# Patient Record
Sex: Male | Born: 1940 | Race: White | Hispanic: No | Smoking: Former smoker
Health system: Southern US, Community
[De-identification: ages and names within clinical notes are randomized; demographics above are authoritative.]

## PROBLEM LIST (undated history)

## (undated) DIAGNOSIS — I2510 Atherosclerotic heart disease of native coronary artery without angina pectoris: Secondary | ICD-10-CM

## (undated) DIAGNOSIS — D696 Thrombocytopenia, unspecified: Secondary | ICD-10-CM

## (undated) DIAGNOSIS — F419 Anxiety disorder, unspecified: Secondary | ICD-10-CM

## (undated) DIAGNOSIS — C801 Malignant (primary) neoplasm, unspecified: Secondary | ICD-10-CM

## (undated) DIAGNOSIS — J45909 Unspecified asthma, uncomplicated: Secondary | ICD-10-CM

## (undated) DIAGNOSIS — D569 Thalassemia, unspecified: Secondary | ICD-10-CM

## (undated) DIAGNOSIS — E785 Hyperlipidemia, unspecified: Secondary | ICD-10-CM

## (undated) DIAGNOSIS — D649 Anemia, unspecified: Secondary | ICD-10-CM

## (undated) DIAGNOSIS — J449 Chronic obstructive pulmonary disease, unspecified: Secondary | ICD-10-CM

## (undated) HISTORY — PX: ESOPHAGUS SURGERY: SHX626

## (undated) HISTORY — DX: Thalassemia, unspecified: D56.9

## (undated) HISTORY — PX: CORONARY STENT PLACEMENT: SHX1402

---

## 2008-12-17 ENCOUNTER — Inpatient Hospital Stay: Payer: Self-pay | Admitting: Internal Medicine

## 2009-03-12 ENCOUNTER — Ambulatory Visit: Payer: Self-pay | Admitting: Gastroenterology

## 2009-09-04 ENCOUNTER — Emergency Department: Payer: Self-pay | Admitting: Emergency Medicine

## 2009-11-11 ENCOUNTER — Ambulatory Visit: Payer: Self-pay | Admitting: Internal Medicine

## 2010-11-18 ENCOUNTER — Emergency Department: Payer: Self-pay | Admitting: Emergency Medicine

## 2012-02-22 ENCOUNTER — Ambulatory Visit: Payer: Self-pay | Admitting: Internal Medicine

## 2013-01-15 ENCOUNTER — Ambulatory Visit: Payer: Self-pay | Admitting: Internal Medicine

## 2013-05-11 ENCOUNTER — Ambulatory Visit: Payer: Self-pay | Admitting: Vascular Surgery

## 2013-05-26 ENCOUNTER — Emergency Department: Payer: Self-pay | Admitting: Emergency Medicine

## 2013-05-26 LAB — TROPONIN I: Troponin-I: <0.02 ng/mL

## 2013-05-26 LAB — BASIC METABOLIC PANEL
Anion Gap: 8 (ref 7–16)
BUN: 11 mg/dL (ref 7–18)
Calcium, Total: 8.6 mg/dL (ref 8.5–10.1)
Chloride: 108 mmol/L — ABNORMAL HIGH (ref 98–107)
Creatinine: 0.94 mg/dL (ref 0.60–1.30)
EGFR (Non-African Amer.): >60
Glucose: 95 mg/dL (ref 65–99)

## 2013-05-26 LAB — CBC
HCT: 34.3 % — ABNORMAL LOW (ref 40.0–52.0)
HGB: 10.5 g/dL — ABNORMAL LOW (ref 13.0–18.0)
MCHC: 30.5 g/dL — ABNORMAL LOW (ref 32.0–36.0)
MCV: 63 fL — ABNORMAL LOW (ref 80–100)
Platelet: 132 10*3/uL — ABNORMAL LOW (ref 150–440)
RDW: 16.1 % — ABNORMAL HIGH (ref 11.5–14.5)
WBC: 11.5 10*3/uL — ABNORMAL HIGH (ref 3.8–10.6)

## 2013-05-29 ENCOUNTER — Emergency Department: Payer: Self-pay | Admitting: Emergency Medicine

## 2013-07-18 ENCOUNTER — Ambulatory Visit: Payer: Self-pay | Admitting: Gastroenterology

## 2013-07-24 ENCOUNTER — Ambulatory Visit: Payer: Self-pay | Admitting: Gastroenterology

## 2013-07-30 LAB — PATHOLOGY REPORT

## 2013-08-02 ENCOUNTER — Ambulatory Visit: Payer: Self-pay | Admitting: Gastroenterology

## 2014-06-26 ENCOUNTER — Ambulatory Visit: Payer: Self-pay | Admitting: Vascular Surgery

## 2014-07-24 ENCOUNTER — Observation Stay: Payer: Self-pay | Admitting: Vascular Surgery

## 2014-07-24 LAB — BASIC METABOLIC PANEL
Anion Gap: 4 — ABNORMAL LOW (ref 7–16)
BUN: 12 mg/dL (ref 7–18)
CHLORIDE: 108 mmol/L — AB (ref 98–107)
Calcium, Total: 8.5 mg/dL (ref 8.5–10.1)
Co2: 31 mmol/L (ref 21–32)
Creatinine: 1.17 mg/dL (ref 0.60–1.30)
EGFR (African American): >60
Glucose: 99 mg/dL (ref 65–99)
Osmolality: 285 (ref 275–301)
POTASSIUM: 3.6 mmol/L (ref 3.5–5.1)
Sodium: 143 mmol/L (ref 136–145)

## 2014-07-25 LAB — BASIC METABOLIC PANEL
Anion Gap: 6 — ABNORMAL LOW (ref 7–16)
BUN: 11 mg/dL (ref 7–18)
CALCIUM: 8.3 mg/dL — AB (ref 8.5–10.1)
CHLORIDE: 106 mmol/L (ref 98–107)
Co2: 29 mmol/L (ref 21–32)
Creatinine: 0.99 mg/dL (ref 0.60–1.30)
EGFR (Non-African Amer.): >60
Glucose: 86 mg/dL (ref 65–99)
OSMOLALITY: 280 (ref 275–301)
Potassium: 3.6 mmol/L (ref 3.5–5.1)
Sodium: 141 mmol/L (ref 136–145)

## 2014-07-25 LAB — CBC WITH DIFFERENTIAL/PLATELET
Basophil #: 0 10*3/uL (ref 0.0–0.1)
Basophil %: 0.4 %
EOS PCT: 1.7 %
Eosinophil #: 0.1 10*3/uL (ref 0.0–0.7)
HCT: 26.6 % — ABNORMAL LOW (ref 40.0–52.0)
HGB: 8.2 g/dL — AB (ref 13.0–18.0)
LYMPHS ABS: 1.1 10*3/uL (ref 1.0–3.6)
Lymphocyte %: 17.6 %
MCH: 20.5 pg — ABNORMAL LOW (ref 26.0–34.0)
MCHC: 31.1 g/dL — ABNORMAL LOW (ref 32.0–36.0)
MCV: 66 fL — AB (ref 80–100)
Monocyte #: 0.4 x10 3/mm (ref 0.2–1.0)
Monocyte %: 5.9 %
Neutrophil #: 4.8 10*3/uL (ref 1.4–6.5)
Neutrophil %: 74.4 %
Platelet: 131 10*3/uL — ABNORMAL LOW (ref 150–440)
RBC: 4.03 10*6/uL — AB (ref 4.40–5.90)
RDW: 15.4 % — ABNORMAL HIGH (ref 11.5–14.5)
WBC: 6.4 10*3/uL (ref 3.8–10.6)

## 2014-07-25 LAB — APTT: Activated PTT: 32.1 secs (ref 23.6–35.9)

## 2014-07-25 LAB — PROTIME-INR
INR: 1.2
PROTHROMBIN TIME: 15.6 s — AB

## 2014-08-15 ENCOUNTER — Inpatient Hospital Stay: Payer: Self-pay | Admitting: Internal Medicine

## 2014-10-20 NOTE — Discharge Summary (Signed)
PATIENT NAME:  Charles Rose, Charles Rose MR#:  [id number] DATE OF BIRTH:  1940-11-25  DATE OF ADMISSION:  08/15/2014 DATE OF DISCHARGE:  08/19/2014  DIAGNOSIS AT TIME OF DISCHARGE:  1.  Chronic obstructive pulmonary exacerbation with acute bronchitis secondary to Staphylococcus aureus.  2.  Anemia.  3.  History of esophageal carcinoma.  4.  Chronic respiratory failure secondary to chronic obstructive pulmonary disease.   CHIEF COMPLAINT:  1.  Cough with productive thick sputum. 2.  Shortness of breath.   HISTORY OF PRESENT ILLNESS: Cicero Noy is a 74 year old male with past medical history significant for COPD, chronic respiratory failure on oxygen therapy at 2 liters nasal cannula, presents to the hospital complaining of shortness of breath, cough and thick, productive brown sputum. The patient had been started on amoxicillin as well as prednisone taper, but continued to be symptomatic. Past medical history is significant for right carotid stenosis with recent percutaneous transluminal angioplasty and stent placement of the right internal carotid artery done on 07/24/2014 by Dr. Delana Meyer. History of COPD, history of thalassemia minor, history of coronary artery disease, status post PCI and stent placement to the RCA, history of GERD, esophageal cancer and thrombocytopenia. Please see H and P for full details.   The patient was admitted to [facility] and received intravenous antibiotics in addition to bronchodilator therapy as well as IV steroid therapy. Chest x-ray on admission showed hyperinflation with patchy airspace disease and streaky interstitial prominence of the right lower lobe perihilar/infrahilar areas, a small amount of patchy airspace disease at the lingula suspicious for multifocal pneumonia. Sputum grew moderate growth of Staphylococcus aureus that was sensitive to Levaquin. The patient was started on intravenous vancomycin and subsequently switched to p.o. Levaquin. His Solu-Medrol was gradually  tapered. His hemoglobin remained stable at 8.6. The patient gradually improved during his stay in the hospital and was discharged home in stable condition on the following medications: Levaquin 500 mg p.o. daily for 7 days; prednisone taper starting at 30 mg a day for 3 days and decrease by 10 mg every 3 days until gone; clopidogrel 75 mg once a day; Spiriva HandiHaler 18 mcg 1 capsule once a day; simvastatin 40 mg once a day; ranitidine 150 p.o. b.i.d.; omeprazole 40 mg once a day; omega-3 fatty acid 1 capsule once a day; Nitrostat 0.4 mg sublingual p.r.n.; Imdur 30 mg once a day; Advair Diskus 250/50 one puff b.i.d.; ferrous sulfate 325 mg once a day; albuterol nebulizer 3 mL q. 6 p.r.n.; ProAir HFA 2 puffs q.i.d. p.r.n.; aspirin 81 mg once a day.   DISCHARGE INSTRUCTIONS: The patient was advised to continue his oxygen at 2 liters nasal cannula and followup with me, Dr. Ginette Pitman, in 1 to 2 weeks' time.   CONDITION ON DISCHARGE: The patient is stable at the time of discharge.   TOTAL TIME SPENT IN DISCHARGING THE PATIENT: Thirty-five minutes.    ____________________________ Tracie Harrier, MD vh:TM D: 08/28/2014 13:26:32 ET T: 08/28/2014 22:57:16 ET JOB#: [id number]  cc: Tracie Harrier, MD, <Dictator> Tracie Harrier MD ELECTRONICALLY SIGNED 09/16/2014 18:07

## 2014-10-20 NOTE — H&P (Signed)
PATIENT NAME:  Charles Rose, COY MR#:  [id number] DATE OF BIRTH:  [birth year]  DATE OF ADMISSION:  08/15/2014  PRIMARY CARE PHYSICIAN: Dr. Ginette Pitman.    PULMONOLOGIST: Dr. Raul Del.   HISTORY OF PRESENT ILLNESS:  The patient is a 74 year old Caucasian male with past medical history significant for history of COPD, chronic respiratory failure on oxygen therapy at home at 2 liters of oxygen through nasal cannula, who comes to the hospital with complaints of shortness of breath, cough, and thick phlegm. According to the patient he was doing well up until his right CEA stent placement operation on 07/24/2014. Postoperatively he was discharged and he was doing well although approximately a week ago he started noticing cough as well as thick phlegm production, initially phlegm looked brown. He was initiated on amoxicillin as well as prednisone taper and his phlegm became dirty white, lighter in the color, however still very thick and difficult to lift it up. He has been wheezing, very short of breath especially whenever he walks around. He has also been weak and because he lives alone he was concerned about passing and injuring himself, so he decided to come to the Emergency Room for further evaluation. In the Emergency Room he had an x-ray done of his lungs and he was noted to have multifocal pneumonia on the right side of his lungs.  Hospitalist services were contacted for admission.   PAST MEDICAL HISTORY: Significant for history of recent right carotid stenosis operation, the patient had percutaneous transluminal angioplasty and stent placement in the right internal carotid artery done on 07/24/2014 by Dr. Delana Meyer, history of chronic respiratory failure on 2 liters of oxygen through nasal cannula at home, COPD, history of minor thalassemia, coronary artery disease status post PCI, stent placed in RCA in June of 2010, history of gastroesophageal reflux disease as well as esophageal cancer status post operation  recently, approximately 8 months ago at [facility], thrombocytopenia.   MEDICATIONS: According to medical records the patient is on Advair Diskus 250/50 one puff twice daily, albuterol 1 inhalation every 6 hours, aspirin 81 mg p.o. daily, Plavix 75 mg p.o. daily, iron sulfate 325 mg p.o. once daily, Imdur 30 mg p.o. daily, Nitrostat 0.4 mg sublingually every 5 minutes as needed, omega 3 fatty acids 1 capsule once daily, omeprazole 40 mg p.o. daily, prednisone 10 mg p.o. taper, also albuterol MDI 2 puffs 4 times daily as needed, ranitidine 150 mg twice daily, simvastatin 20 mg p.o. daily, Spiriva 18 mcg once daily.   SOCIAL HISTORY: The patient used to smoke 1-1/2 packs a day for approximately 40 years, quit in March 2010. No alcohol abuse, drug abuse.  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  Lives alone in [place] area. He is divorced. He is accompanied by his sister here in the hospital.   FAMILY HISTORY: Congestive heart failure in patient's father. The patient's mother had diabetes as well as thalassemia.   ALLERGIES: No known drug allergies.   REVIEW OF SYSTEMS:  Positive for cough, phlegm production, feeling hot and chilly and fatigued in the home.  He has headaches. Also weight loss approximately 24 pounds since Christmas.  He admits of having poor appetite as well as difficulty chewing his food since he has no dentures. He admits of having some floaters in the left eye, also bifocal glasses.  He has significant nearsightedness.  He is supposed to see ophthalmologist in May 2016 to possibly get cataracts removed.  Also admits of shortness of breath especially whenever he moves  around although he admits to checking his O2 saturations at home and it was 95% on room air. He admits of having tachycardia intermittently and feeling arrhythmia, also feeling presyncopal. In December he fell down and hit his right cheekbone, injured himself. He is afraid about the same although denies any significant  lightheadedness or dizziness recently. Admits of having intermittent diarrhea and constipation. His last bowel movement was 1-2 days ago. Admits of having weak stream whenever he urinates, however states that he empties his bladder well. Admits of itchy skin which he attributes to nerves.  Denies any high fevers, weight gain.   EYES:  Denies any blurry vision, double vision, glaucoma.  EARS, NOSE, AND THROAT: Denies any tinnitus, allergies, epistaxis, sinus pain, dentures, difficulty swallowing.  RESPIRATORY: Denies hemoptysis, asthma. Admits to COPD.   CARDIOVASCULAR:  Denies any chest pain, orthopnea, edema, or palpitations.  GASTROINTESTINAL: Denies any nausea, vomiting, rectal bleeding, change in bowel habits.  GENITOURINARY: Denies dysuria, hematuria, frequency, incontinence.  ENDOCRINOLOGY: Denies polydipsia, nocturia, thyroid problems, heat or cold intolerance, or thirst.   HEMATOLOGIC: Denies anemia, easy bruising or bleeding, swollen glands.  SKIN: Denies acne, rashes, lesions, change in moles.  MUSCULOSKELETAL: Denies arthritis, cramps, swelling.  NEUROLOGIC:  Denies numbness, epilepsy, tremor.  PSYCHIATRIC: Denies anxiety, insomnia, depression.   PHYSICAL EXAMINATION:   VITAL SIGNS:  On arrival to the hospital the patient's vital signs, temperature was 98.5, pulse was 90, respirations 24, blood pressure 108/61, saturation was 93% on room air.  GENERAL: Thin Caucasian male sitting on the stretcher.  HEENT: His pupils are equal and reactive to light. Extraocular muscles intact. No icterus or conjunctivitis. Has normal hearing. No pharyngeal erythema. Mucosa is dry.  NECK: No masses. Supple, nontender. Thyroid is not enlarged. No adenopathy. No JVD or carotid bruits bilaterally. Full range of motion.  LUNGS: Relatively good air entrance bilaterally, however significantly diminished at bases bilaterally. The patient has rhonchi as well as rales and wheezing whenever he coughs, especially in  the right base. The patient does have labored inspirations as well as increased effort to breathe, is in mild respiratory distress whenever he tries to cough or speak longer sentences.  CARDIOVASCULAR: S1, S2 appreciated. Rhythm is regular. PMI not lateralized.  Chest is nontender to palpation.  1 + pedal pulses.   EXTREMITIES:  No lower extremity edema, calf tenderness, or cyanosis was noted.  ABDOMEN: Soft, nontender. Bowel sounds are present. No hepatosplenomegaly or masses were noted.  RECTAL: Deferred.  MUSCLE STRENGTH: Able to move all extremities. No cyanosis, degenerative joint disease, or kyphosis. Gait was not tested.  SKIN: Did not reveal any rashes, lesions, erythema, nodularity, or induration. It was warm and dry to palpation.  LYMPHATIC: No adenopathy in the cervical region.  NEUROLOGICAL: Cranial nerves grossly intact. Sensory is intact.  No dysarthria or aphasia. The patient is alert, oriented to time, person, and place, cooperative. Memory is good.  PSYCHIATRIC: No significant confusion, agitation, or depression.   EKG showed normal sinus rhythm at the 89 beats per minute, right bundle branch block, left anterior fascicular block, and nonspecific ST-T changes.   LABORATORY DATA: BMP showed sodium 147 and chloride 111. Calcium level 8.2. Troponin less than 0.02. White blood cell count is normal at 7.5, hemoglobin is 8.6, platelet count was 210,000. In comparison the patient's hemoglobin level was 8.2 on 07/25/2014. He has MCV is low at 66.   RADIOLOGIC STUDIES:  Chest x-ray PA and lateral 08/11/2014 showed hyperinflation, patchy airspace disease with streaky  interstitial prominence in the right lower lobe, perihilar, and infrahilar area, a small amount of patchy airspace disease in the lingula, findings were suspicious for multifocal pneumonia.   ASSESSMENT AND PLAN:  1.  Right lower lobe and lingula as well as infrahilar pneumonia due to unknown infectious agent at this time,  questionable aspiration perioperatively. We will get sputum cultures. We will continue the patient on Rocephin and Zithromax for now.  2.  Chronic obstructive pulmonary disease exacerbation. We will continue Solu-Medrol and inhalers as well as nebulizers.  3.  Hyponatremia. We will initiate the patient on D5 water.  4.  Dysphagia. We will initiate the patient on mechanical soft diet for now and ask speech therapist to see patient.  5.  Anemia. Get guaiac.  6.  History of thrombocytopenia. The patient's platelet count is normal at present.  7.  Generalized weakness. Will get physical therapist involved for further recommendations. The patient may benefit from home health physical therapy.   TIME SPENT: 45 minutes.     ____________________________ Theodoro Grist, MD rv:bu D: 08/15/2014 14:27:59 ET T: 08/15/2014 15:33:41 ET JOB#: [id number]  cc: Theodoro Grist, MD, <Dictator> Tracie Harrier, MD Melquiades Kovar MD ELECTRONICALLY SIGNED 09/22/2014 12:50

## 2014-10-20 NOTE — Op Note (Signed)
PATIENT NAME:  Charles Rose, Charles Rose MR#:  [id number] DATE OF BIRTH:  1940/09/16  DATE OF PROCEDURE:  07/24/2014  PREOPERATIVE DIAGNOSES:  1.  Symptomatic right internal carotid artery.  2.  Transient ischemic attack.  3.  Chronic obstructive pulmonary disease.  4.  Coronary artery disease.  POSTOPERATIVE DIAGNOSES: 1.  Symptomatic right internal carotid artery.  2.  Transient ischemic attack.  3.  Chronic obstructive pulmonary disease.  4.  Coronary artery disease.  PROCEDURES PERFORMED: 1.  Arch aortogram. 2.  Selective injection of the right cervical and cerebral carotid arteries.  3.  Percutaneous transluminal angioplasty and stent placement, right internal carotid artery.   PROCEDURE PERFORMED BY: Katha Cabal, MD   ASSISTANT: Algernon Huxley, MD  SEDATION: Versed 3 mg plus fentanyl 100 mcg administered IV. Continuous ECG, pulse oximetry and cardiopulmonary monitoring is performed throughout the entire procedure by the interventional radiology nurse. Total sedation time is 40 minutes.   ACCESS: A 6 French sheath, right common femoral artery.   FLUOROSCOPY TIME: 4.7 minutes.   CONTRAST USED: Isovue 75 mL.   INDICATIONS: Mr. Clerk is a 74 year old gentleman who presents to the office after neurological changes. Workup demonstrated rather advanced COPD. He also has mild coronary artery disease. Given his high risk lung condition as well as his symptomatic carotid, carotid stenting is recommended. Surgery was also discussed. The risks and benefits were reviewed. All questions answered. The patient wishes to proceed.   DESCRIPTION OF PROCEDURE: The patient is taken to special procedures, placed in the supine position. After adequate sedation is achieved, both groins are prepped and draped in sterile fashion. Ultrasound is placed in a sterile sleeve. Ultrasound is utilized secondary to lack of appropriate landmarks and to avoid vascular injury. Under real-time visualization, the common  femoral artery is anesthetized with 1% lidocaine and then accessed with a micropuncture needle, microwire, followed by micro sheath, J-wire followed by a 5 French sheath and 5 French pigtail catheter.   The pigtail catheter is positioned in the ascending aorta and an LAO projection of the arch is obtained. Stiff-angled Glidewire is then used to removed the pigtail catheter and a JB-1 catheter is advanced over the wire and used to select the innominate. Wire is then advanced into the carotid and the catheter is advanced up to the midportion of the common carotid artery.   Hand injection of contrast then demonstrates the carotid anatomy, and upon visualizing the origin of the external carotid, the stiff-angled Glidewire is selected and the catheter is then advanced into the external carotid artery. Once access had been achieved, 5000 units of heparin was given. With the catheter in the external iliac, the Amplatz Super Stiff wire is advanced, catheter is removed, and the 6 [place] shuttle sheath is advanced over the wire and positioned with its tip in the distal common carotid. ACT is now attained, it is 280, and therefore an additional 1000 units of heparin is given.   Both AP and lateral cervical and cerebral views are then obtained. Best working angle is the lateral and in this the detector is positioned. The large Emboshield is advanced through the sheath and negotiated into the distal internal carotid artery. It is deployed without difficulty. A 9 x 7, 40 cm long tapered Xact stent is then advanced across the lesion. Image is obtained for placement and the stent deployed. The patient was pretreated with 1 amp of atropine with a baseline heart rate of 62. The 5 x 2 balloon was  then advanced across the waist within the stent, inflated to 10 atmospheres briefly and then taken down. Followup imaging demonstrates excellent stent positioning with good apposition and therefore the Emboshield is retrieved without  difficulty.   Once again, cerebral and cervical views are obtained. There is significant improvement in filling of the anterior cerebral on the intracranial imaging. The cervical imaging demonstrates the stent is well approximated and expanded quite nicely.   The sheath is then pulled into the external iliac on the right. Oblique view is obtained and a StarClose device deployed. There are no immediate complications.   INTERPRETATIONS: Initial imaging demonstrates the aortic arch is of normal configuration. There are no hemodynamically significant ostial stenoses of the great vessels. Within the internal carotid artery there is a string sign noted. External origin bulb and common carotid appear widely patent. Intracranially, there is filling of the middle cerebral as well as the anterior, but the anterior initially is somewhat poor to fill, quite slow and does not arborize well.   Following stent placement as described above, there is excellent placement of the stent with significant improvement in flow filling the anterior cerebral.   The patient tolerated the procedure well. There were no neurological changes. He is awake, alert and moving all extremities following commands at the conclusion of the procedure.   ____________________________ Katha Cabal, MD ggs:TT D: 07/24/2014 11:37:36 ET T: 07/24/2014 16:01:24 ET JOB#: [id number]  cc: Katha Cabal, MD, <Dictator> Tracie Harrier, MD Katha Cabal MD ELECTRONICALLY SIGNED 07/24/2014 17:44

## 2015-01-01 ENCOUNTER — Emergency Department
Admission: EM | Admit: 2015-01-01 | Discharge: 2015-01-02 | Disposition: A | Discharge status: Home / Self Care | Payer: Medicare Other | Attending: Emergency Medicine | Admitting: Emergency Medicine

## 2015-01-01 ENCOUNTER — Encounter: Payer: Self-pay | Admitting: Emergency Medicine

## 2015-01-01 ENCOUNTER — Emergency Department: Payer: Medicare Other

## 2015-01-01 DIAGNOSIS — S22000A Wedge compression fracture of unspecified thoracic vertebra, initial encounter for closed fracture: Secondary | ICD-10-CM

## 2015-01-01 DIAGNOSIS — Y9389 Activity, other specified: Secondary | ICD-10-CM | POA: Insufficient documentation

## 2015-01-01 DIAGNOSIS — S22089A Unspecified fracture of T11-T12 vertebra, initial encounter for closed fracture: Secondary | ICD-10-CM | POA: Diagnosis not present

## 2015-01-01 DIAGNOSIS — I10 Essential (primary) hypertension: Secondary | ICD-10-CM | POA: Diagnosis not present

## 2015-01-01 DIAGNOSIS — Y998 Other external cause status: Secondary | ICD-10-CM | POA: Diagnosis not present

## 2015-01-01 DIAGNOSIS — J441 Chronic obstructive pulmonary disease with (acute) exacerbation: Secondary | ICD-10-CM | POA: Diagnosis not present

## 2015-01-01 DIAGNOSIS — S22069A Unspecified fracture of T7-T8 vertebra, initial encounter for closed fracture: Secondary | ICD-10-CM | POA: Diagnosis not present

## 2015-01-01 DIAGNOSIS — R079 Chest pain, unspecified: Secondary | ICD-10-CM | POA: Diagnosis present

## 2015-01-01 DIAGNOSIS — X58XXXA Exposure to other specified factors, initial encounter: Secondary | ICD-10-CM | POA: Diagnosis not present

## 2015-01-01 DIAGNOSIS — Y9289 Other specified places as the place of occurrence of the external cause: Secondary | ICD-10-CM | POA: Diagnosis not present

## 2015-01-01 HISTORY — DX: Anxiety disorder, unspecified: F41.9

## 2015-01-01 HISTORY — DX: Chronic obstructive pulmonary disease, unspecified: J44.9

## 2015-01-01 HISTORY — DX: Atherosclerotic heart disease of native coronary artery without angina pectoris: I25.10

## 2015-01-01 HISTORY — DX: Malignant (primary) neoplasm, unspecified: C80.1

## 2015-01-01 HISTORY — DX: Anemia, unspecified: D64.9

## 2015-01-01 HISTORY — DX: Thrombocytopenia, unspecified: D69.6

## 2015-01-01 HISTORY — DX: Unspecified asthma, uncomplicated: J45.909

## 2015-01-01 HISTORY — DX: Hyperlipidemia, unspecified: E78.5

## 2015-01-01 LAB — CBC WITH DIFFERENTIAL/PLATELET
BASOS PCT: 0 %
Basophils Absolute: 0 10*3/uL (ref 0–0.1)
EOS ABS: 0.1 10*3/uL (ref 0–0.7)
Eosinophils Relative: 1 %
HCT: 32.3 % — ABNORMAL LOW (ref 40.0–52.0)
Hemoglobin: 10.1 g/dL — ABNORMAL LOW (ref 13.0–18.0)
LYMPHS PCT: 14 %
Lymphs Abs: 1.6 10*3/uL (ref 1.0–3.6)
MCH: 20.4 pg — ABNORMAL LOW (ref 26.0–34.0)
MCHC: 31.2 g/dL — ABNORMAL LOW (ref 32.0–36.0)
MCV: 65.4 fL — ABNORMAL LOW (ref 80.0–100.0)
MONOS PCT: 8 %
Monocytes Absolute: 0.9 10*3/uL (ref 0.2–1.0)
Neutro Abs: 8.8 10*3/uL — ABNORMAL HIGH (ref 1.4–6.5)
Neutrophils Relative %: 77 %
PLATELETS: 136 10*3/uL — AB (ref 150–440)
RBC: 4.94 MIL/uL (ref 4.40–5.90)
RDW: 15.8 % — AB (ref 11.5–14.5)
WBC: 11.5 10*3/uL — ABNORMAL HIGH (ref 3.8–10.6)

## 2015-01-01 LAB — COMPREHENSIVE METABOLIC PANEL
ALBUMIN: 3.9 g/dL (ref 3.5–5.0)
ALT: 29 U/L (ref 17–63)
AST: 25 U/L (ref 15–41)
Alkaline Phosphatase: 49 U/L (ref 38–126)
Anion gap: 8 (ref 5–15)
BILIRUBIN TOTAL: 1 mg/dL (ref 0.3–1.2)
BUN: 17 mg/dL (ref 6–20)
CO2: 28 mmol/L (ref 22–32)
Calcium: 8.6 mg/dL — ABNORMAL LOW (ref 8.9–10.3)
Chloride: 103 mmol/L (ref 101–111)
Creatinine, Ser: 0.88 mg/dL (ref 0.61–1.24)
GFR calc Af Amer: >60 mL/min (ref >60)
GFR calc non Af Amer: >60 mL/min (ref >60)
Glucose, Bld: 93 mg/dL (ref 65–99)
Potassium: 3.7 mmol/L (ref 3.5–5.1)
Sodium: 139 mmol/L (ref 135–145)
Total Protein: 6.1 g/dL — ABNORMAL LOW (ref 6.5–8.1)

## 2015-01-01 LAB — TROPONIN I: Troponin I: <0.03 ng/mL (ref <0.031)

## 2015-01-01 MED ORDER — IOHEXOL 350 MG/ML SOLN
100.0000 mL | Freq: Once | INTRAVENOUS | Status: AC | PRN
  Administered 2015-01-01: 100 mL via INTRAVENOUS

## 2015-01-01 MED ORDER — METHYLPREDNISOLONE SODIUM SUCC 125 MG IJ SOLR
125.0000 mg | Freq: Once | INTRAMUSCULAR | Status: AC
  Administered 2015-01-01: 125 mg via INTRAVENOUS
  Filled 2015-01-01: qty 2

## 2015-01-01 MED ORDER — IPRATROPIUM-ALBUTEROL 0.5-2.5 (3) MG/3ML IN SOLN
3.0000 mL | Freq: Once | RESPIRATORY_TRACT | Status: AC
  Administered 2015-01-01: 3 mL via RESPIRATORY_TRACT
  Filled 2015-01-01: qty 3

## 2015-01-01 NOTE — ED Notes (Signed)
Patient back from CT.

## 2015-01-01 NOTE — ED Provider Notes (Signed)
[facility] Emergency Department Provider Note  Time seen: 10:12 PM  I have reviewed the triage vital signs and the nursing notes.   HISTORY  Chief Complaint Chest Pain    HPI Macari Zalesky is a 74 y.o. male with a past medical history of hypertension, hyperlipidemia, COPD, presents the emergency department with chest pain since 3 PM. According to the patient he developed chest pain and tightness since 3 PM today. He states it is worse when he takes a deep breath, or any time he breathes in. Patient states he rarely gets chest pain. He does have a history of a coronary stent approximately 5 years ago by Dr. Nehemiah Massed. He also states a known stent to his right carotid artery. Patient currently takes aspirin and Plavix. Patient took nitroglycerin at home with minimal relief of his discomfort. So he decided to come to the emergency department for evaluation. Patient denies any nausea, he does state some mild shortness of breath, moderate chest pain with inspiration. Patient states the chest pain is constant, but worse with inspiration. Denies any leg swelling or pain. Denies any history of blood clots in the past. The patient does have a history of esophageal cancer which per the patient is currently in remission.     No past medical history on file.  There are no active problems to display for this patient.   No past surgical history on file.  No current outpatient prescriptions on file.  Allergies Review of patient's allergies indicates not on file.  No family history on file.  Social History History  Substance Use Topics  . Smoking status: Not on file  . Smokeless tobacco: Not on file  . Alcohol Use: Not on file    Review of Systems Constitutional: Negative for fever. Cardiovascular: Positive for chest pain Respiratory: Positive for shortness of breath. Chest pain worse with inspiration. Gastrointestinal: Negative for abdominal pain, vomiting  and diarrhea. Genitourinary: Negative for dysuria. Musculoskeletal: Negative for back pain.  10-point ROS otherwise negative.  ____________________________________________   PHYSICAL EXAM:  VITAL SIGNS: ED Triage Vitals  Enc Vitals Group     BP --      Pulse --      Resp --      Temp --      Temp src --      SpO2 01/01/15 2203 94 %     Weight --      Height --      Head Cir --      Peak Flow --      Pain Score --      Pain Loc --      Pain Edu? --      Excl. in [place]? --     Constitutional: Alert and oriented. Well appearing and in no distress. Eyes: Normal exam ENT   Mouth/Throat: Mucous membranes are moist. Cardiovascular: Normal rate, regular rhythm. No murmur Respiratory: Normal respiratory effort without tachypnea nor retractions. No tenderness to palpation. Patient does have moderate wheezes bilaterally. Gastrointestinal: Soft and nontender. No distention.   Musculoskeletal: Nontender with normal range of motion in all extremities. No lower extremity tenderness or edema. Neurologic:  Normal speech and language. No gross focal neurologic deficits are appreciated. Speech is normal. Skin:  Skin is warm, dry and intact.  Psychiatric: Mood and affect are normal. Speech and behavior are normal. Patient exhibits appropriate insight and judgment.  ____________________________________________    EKG  EKG reviewed and interpreted by myself shows  normal sinus rhythm at 83 bpm, widened QRS, left axis deviation, most consistent with a bifascicular block, nonspecific ST changes present. No ST elevations noted.  ____________________________________________    RADIOLOGY  CT negative for PE.  ____________________________________________    INITIAL IMPRESSION / ASSESSMENT AND PLAN / ED COURSE  Pertinent labs & imaging results that were available during my care of the patient were reviewed by me and considered in my medical decision making (see chart for  details).  Patient presents with approximately 7 hours of chest pain. Patient states the chest pain is worse with inspiration. Given his history of cancer, and pleuritic chest pain we will proceed with a CT scan to evaluate for pulmonary emboli. Given the patient's history of coronary stents, we will obtain lab work including troponin to help further evaluate. Patient does have moderate wheeze on expiration, has a history of COPD but denies any home oxygen use. We will treat with Solu-Medrol and DuoNeb's while awaiting laboratory and CT results. Patient took a full-strength aspirin in route to the hospital.  Pt care signed out to Dr. Reita Cliche CT pending  ____________________________________________   FINAL CLINICAL IMPRESSION(S) / ED DIAGNOSES  Chest pain COPD exacerbation   Harvest Dark, MD 01/06/15 2137

## 2015-01-01 NOTE — ED Notes (Signed)
Patient present to ED with complaint of chest pain since 3:00 am this morning, reports pain woke him up and has been in continuous pain all day today. Nook 1 Nitro at home with no relief, reports pain increases when taking in deep breaths. Per EMS 1 Nitro given without relief and 324 Aspirin given en route. Patient is on O2 PRN, patient states normally he is on O2 80% of the time but states this week he has been on it 100% of time. Patient reports has had 20 lbs of unintentional weight loss in the last month. Patient treated by his PCP for upper respiratory infection. Patient a&o x 4, respirations even and unlabored, does not appear to have a work increase in breathing. MD at bedside, call bell within reach.

## 2015-01-02 NOTE — Discharge Instructions (Signed)
We discussed no certain cause was found for your chest pain, however it is suspect it's either due to esophageal pain, or COPD. Your CT scan showed a couple of thoracic spine compression fractures that you need to discuss with your doctor further evaluation to make sure that these are not related to bone cancer.  Return to the emergency department for any new or worsening condition including trouble breathing, wheezing, fever, altered mental status, chest pain, nausea, sweating, passing out.  You need to see your primary care doctor this week, one to 2 days. He will likely also need to be referred to see Dr. Raul Del within the next week as well.   Chest Pain (Nonspecific) It is often hard to give a specific diagnosis for the cause of chest pain. There is always a chance that your pain could be related to something serious, such as a heart attack or a blood clot in the lungs. You need to follow up with your health care provider for further evaluation. CAUSES   Heartburn.  Pneumonia or bronchitis.  Anxiety or stress.  Inflammation around your heart (pericarditis) or lung (pleuritis or pleurisy).  A blood clot in the lung.  A collapsed lung (pneumothorax). It can develop suddenly on its own (spontaneous pneumothorax) or from trauma to the chest.  Shingles infection (herpes zoster virus). The chest wall is composed of bones, muscles, and cartilage. Any of these can be the source of the pain.  The bones can be bruised by injury.  The muscles or cartilage can be strained by coughing or overwork.  The cartilage can be affected by inflammation and become sore (costochondritis). DIAGNOSIS  Lab tests or other studies may be needed to find the cause of your pain. Your health care provider may have you take a test called an ambulatory electrocardiogram (ECG). An ECG records your heartbeat patterns over a 24-hour period. You may also have other tests, such as:  Transthoracic echocardiogram  (TTE). During echocardiography, sound waves are used to evaluate how blood flows through your heart.  Transesophageal echocardiogram (TEE).  Cardiac monitoring. This allows your health care provider to monitor your heart rate and rhythm in real time.  Holter monitor. This is a portable device that records your heartbeat and can help diagnose heart arrhythmias. It allows your health care provider to track your heart activity for several days, if needed.  Stress tests by exercise or by giving medicine that makes the heart beat faster. TREATMENT   Treatment depends on what may be causing your chest pain. Treatment may include:  Acid blockers for heartburn.  Anti-inflammatory medicine.  Pain medicine for inflammatory conditions.  Antibiotics if an infection is present.  You may be advised to change lifestyle habits. This includes stopping smoking and avoiding alcohol, caffeine, and chocolate.  You may be advised to keep your head raised (elevated) when sleeping. This reduces the chance of acid going backward from your stomach into your esophagus. Most of the time, nonspecific chest pain will improve within 2-3 days with rest and mild pain medicine.  HOME CARE INSTRUCTIONS   If antibiotics were prescribed, take them as directed. Finish them even if you start to feel better.  For the next few days, avoid physical activities that bring on chest pain. Continue physical activities as directed.  Do not use any tobacco products, including cigarettes, chewing tobacco, or electronic cigarettes.  Avoid drinking alcohol.  Only take medicine as directed by your health care provider.  Follow your health care provider's  suggestions for further testing if your chest pain does not go away.  Keep any follow-up appointments you made. If you do not go to an appointment, you could develop lasting (chronic) problems with pain. If there is any problem keeping an appointment, call to reschedule. SEEK  MEDICAL CARE IF:   Your chest pain does not go away, even after treatment.  You have a rash with blisters on your chest.  You have a fever. SEEK IMMEDIATE MEDICAL CARE IF:   You have increased chest pain or pain that spreads to your arm, neck, jaw, back, or abdomen.  You have shortness of breath.  You have an increasing cough, or you cough up blood.  You have severe back or abdominal pain.  You feel nauseous or vomit.  You have severe weakness.  You faint.  You have chills. This is an emergency. Do not wait to see if the pain will go away. Get medical help at once. Call your local emergency services (911 in [place] Do not drive yourself to the hospital. MAKE SURE YOU:   Understand these instructions.  Will watch your condition.  Will get help right away if you are not doing well or get worse. Document Released: 03/17/2005 Document Revised: 06/12/2013 Document Reviewed: 01/11/2008 [facility] Patient Information 2015 [place], [place]. This information is not intended to replace advice given to you by your health care provider. Make sure you discuss any questions you have with your health care provider.  Chronic Obstructive Pulmonary Disease Exacerbation  Chronic obstructive pulmonary disease (COPD) is a common lung problem. In COPD, the flow of air from the lungs is limited. COPD exacerbations are times that breathing gets worse and you need extra treatment. Without treatment they can be life threatening. If they happen often, your lungs can become more damaged. HOME CARE  Do not smoke.  Avoid tobacco smoke and other things that bother your lungs.  If given, take your antibiotic medicine as told. Finish the medicine even if you start to feel better.  Only take medicines as told by your doctor.  Drink enough fluids to keep your pee (urine) clear or pale yellow (unless your doctor has told you not to).  Use a cool mist machine (vaporizer).  If you use oxygen or a machine  that turns liquid medicine into a mist (nebulizer), continue to use them as told.  Keep up with shots (vaccinations) as told by your doctor.  Exercise regularly.  Eat healthy foods.  Keep all doctor visits as told. GET HELP RIGHT AWAY IF:  You are very short of breath and it gets worse.  You have trouble talking.  You have bad chest pain.  You have blood in your spit (sputum).  You have a fever.  You keep throwing up (vomiting).  You feel weak, or you pass out (faint).  You feel confused.  You keep getting worse. MAKE SURE YOU:   Understand these instructions.  Will watch your condition.  Will get help right away if you are not doing well or get worse. Document Released: 05/27/2011 Document Revised: 03/28/2013 Document Reviewed: 02/09/2013 [facility] Patient Information 2015 [place], [place]. This information is not intended to replace advice given to you by your health care provider. Make sure you discuss any questions you have with your health care provider.

## 2015-01-02 NOTE — ED Notes (Signed)

## 2015-01-02 NOTE — ED Provider Notes (Signed)
[facility]  I accepted care from Dr. Kerman Passey ____________________________________________    LABS (pertinent positives/negatives)  White blood cell count 11.5, hemoglobin 10.1, platelets 136 Troponin less than 8.11 Metabolic Panel within normal limits   ____________________________________________    RADIOLOGY All xrays were viewed by me. Imaging interpreted by radiologist.  CT chest with contrast:  IMPRESSION: No evidence of significant pulmonary embolus. Mild diffuse wall thickening of the esophagus may be related to known esophageal carcinoma or reflux disease. No esophageal obstruction.  Diffuse emphysema, fibrosis, and chronic bronchitic changes. Stable compression fracture of L1 with new compression fractures of T11 and T7. Bone metastasis should be excluded.  ____________________________________________   PROCEDURES  Procedure(s) performed: None  Critical Care performed: None  ____________________________________________   INITIAL IMPRESSION / ASSESSMENT AND PLAN / ED COURSE  CONSULTATIONS: None  Pertinent labs & imaging results that were available during my care of the patient were reviewed by me and considered in my medical decision making (see chart for details).  Patient's main complaint for evaluation was chest pain that wasn't going away since it started around 3 PM. He was noted to have wheezing when he came in and this is now resolved. His chest pain has resolved as well. When questioning the patient, he thinks he probably ate his pulse eggs too fast and got indigestion, however given that it was evening time he wanted to be checked out. In terms of his COPD, he is currently on Levaquin and in the middle of a prednisone taper. This is the second course of steroids within the month. He is currently taking a rescue inhaler as needed, as well as albuterol and ipratropium nebulizer 3-5 times per day. I discussed with him that if  he is getting worse or certainly no better with a COPD exacerbation despite being on these therapies, he may need come to the hospital, and he stated that he is actually not that worried about his breathing, he just wanted evaluation for the chest discomfort. He thinks it's esophageal in nature. I also discussed with him the results of the thoracic compression fractures, which she did not know about. He needs to follow up with his primary care doctor for further evaluation of whether or not these could be metastases to the bone.  Patient states he wears 2 L nasal cannula about 70% of the time. His O2 sat here is stable. He is going home with friends.  Patient / Family / Caregiver informed of clinical course, medical decision-making process, and agree with plan.   I discussed return precautions, follow-up instructions, and discharged instructions with patient and/or family.  ____________________________________________   FINAL CLINICAL IMPRESSION(S) / ED DIAGNOSES  Final diagnoses:  Chest pain, unspecified chest pain type  Compression fracture of body of thoracic vertebra  Chronic obstructive pulmonary disease with acute exacerbation     FOLLOW UP   Referred to: Primary care physician Dr. Ginette Pitman and poor knowledge is Dr. Hanley Ben, MD 01/02/15 [phone]

## 2015-01-07 ENCOUNTER — Other Ambulatory Visit: Payer: Self-pay | Admitting: Internal Medicine

## 2015-01-09 ENCOUNTER — Other Ambulatory Visit: Payer: Self-pay | Admitting: Internal Medicine

## 2015-01-09 DIAGNOSIS — M549 Dorsalgia, unspecified: Secondary | ICD-10-CM

## 2015-01-12 ENCOUNTER — Inpatient Hospital Stay
Admission: EM | Admit: 2015-01-12 | Discharge: 2015-01-17 | DRG: 191 | Disposition: A | Discharge status: Skilled Nursing Facility | Payer: Medicare Other | Attending: Internal Medicine | Admitting: Internal Medicine

## 2015-01-12 ENCOUNTER — Other Ambulatory Visit: Payer: Self-pay

## 2015-01-12 ENCOUNTER — Emergency Department: Payer: Medicare Other

## 2015-01-12 ENCOUNTER — Encounter: Payer: Self-pay | Admitting: Emergency Medicine

## 2015-01-12 DIAGNOSIS — Z955 Presence of coronary angioplasty implant and graft: Secondary | ICD-10-CM

## 2015-01-12 DIAGNOSIS — M542 Cervicalgia: Secondary | ICD-10-CM

## 2015-01-12 DIAGNOSIS — R0600 Dyspnea, unspecified: Secondary | ICD-10-CM | POA: Diagnosis present

## 2015-01-12 DIAGNOSIS — Z7982 Long term (current) use of aspirin: Secondary | ICD-10-CM

## 2015-01-12 DIAGNOSIS — J45909 Unspecified asthma, uncomplicated: Secondary | ICD-10-CM | POA: Diagnosis present

## 2015-01-12 DIAGNOSIS — Z8501 Personal history of malignant neoplasm of esophagus: Secondary | ICD-10-CM

## 2015-01-12 DIAGNOSIS — M549 Dorsalgia, unspecified: Secondary | ICD-10-CM | POA: Diagnosis present

## 2015-01-12 DIAGNOSIS — D649 Anemia, unspecified: Secondary | ICD-10-CM | POA: Diagnosis present

## 2015-01-12 DIAGNOSIS — Z7952 Long term (current) use of systemic steroids: Secondary | ICD-10-CM

## 2015-01-12 DIAGNOSIS — I2510 Atherosclerotic heart disease of native coronary artery without angina pectoris: Secondary | ICD-10-CM | POA: Diagnosis present

## 2015-01-12 DIAGNOSIS — M4802 Spinal stenosis, cervical region: Secondary | ICD-10-CM | POA: Diagnosis present

## 2015-01-12 DIAGNOSIS — Z87891 Personal history of nicotine dependence: Secondary | ICD-10-CM | POA: Diagnosis not present

## 2015-01-12 DIAGNOSIS — K219 Gastro-esophageal reflux disease without esophagitis: Secondary | ICD-10-CM | POA: Diagnosis present

## 2015-01-12 DIAGNOSIS — J441 Chronic obstructive pulmonary disease with (acute) exacerbation: Principal | ICD-10-CM | POA: Diagnosis present

## 2015-01-12 DIAGNOSIS — Z792 Long term (current) use of antibiotics: Secondary | ICD-10-CM | POA: Diagnosis not present

## 2015-01-12 DIAGNOSIS — E875 Hyperkalemia: Secondary | ICD-10-CM | POA: Diagnosis present

## 2015-01-12 DIAGNOSIS — D696 Thrombocytopenia, unspecified: Secondary | ICD-10-CM | POA: Diagnosis present

## 2015-01-12 DIAGNOSIS — F419 Anxiety disorder, unspecified: Secondary | ICD-10-CM | POA: Diagnosis present

## 2015-01-12 DIAGNOSIS — Z9981 Dependence on supplemental oxygen: Secondary | ICD-10-CM | POA: Diagnosis not present

## 2015-01-12 DIAGNOSIS — I10 Essential (primary) hypertension: Secondary | ICD-10-CM | POA: Diagnosis present

## 2015-01-12 DIAGNOSIS — Z7902 Long term (current) use of antithrombotics/antiplatelets: Secondary | ICD-10-CM | POA: Diagnosis not present

## 2015-01-12 DIAGNOSIS — J9610 Chronic respiratory failure, unspecified whether with hypoxia or hypercapnia: Secondary | ICD-10-CM | POA: Diagnosis present

## 2015-01-12 DIAGNOSIS — Z8249 Family history of ischemic heart disease and other diseases of the circulatory system: Secondary | ICD-10-CM

## 2015-01-12 DIAGNOSIS — R05 Cough: Secondary | ICD-10-CM | POA: Diagnosis present

## 2015-01-12 DIAGNOSIS — D759 Disease of blood and blood-forming organs, unspecified: Secondary | ICD-10-CM | POA: Diagnosis present

## 2015-01-12 DIAGNOSIS — R0603 Acute respiratory distress: Secondary | ICD-10-CM

## 2015-01-12 DIAGNOSIS — E785 Hyperlipidemia, unspecified: Secondary | ICD-10-CM | POA: Diagnosis present

## 2015-01-12 LAB — BLOOD GAS, VENOUS
Acid-Base Excess: 1.5 mmol/L (ref 0.0–3.0)
Bicarbonate: 26.6 mEq/L (ref 21.0–28.0)
FIO2: 0.35 %
Mode: POSITIVE
PCO2 VEN: 43 mmHg — AB (ref 44.0–60.0)
Patient temperature: 37
pH, Ven: 7.4 (ref 7.320–7.430)

## 2015-01-12 LAB — COMPREHENSIVE METABOLIC PANEL
ALBUMIN: 3.9 g/dL (ref 3.5–5.0)
ALT: 19 U/L (ref 17–63)
AST: 40 U/L (ref 15–41)
Alkaline Phosphatase: 60 U/L (ref 38–126)
Anion gap: 9 (ref 5–15)
BILIRUBIN TOTAL: 0.8 mg/dL (ref 0.3–1.2)
BUN: 16 mg/dL (ref 6–20)
CHLORIDE: 106 mmol/L (ref 101–111)
CO2: 25 mmol/L (ref 22–32)
CREATININE: 1.21 mg/dL (ref 0.61–1.24)
Calcium: 8.7 mg/dL — ABNORMAL LOW (ref 8.9–10.3)
GFR calc Af Amer: >60 mL/min (ref >60)
GFR, EST NON AFRICAN AMERICAN: 58 mL/min — AB (ref >60)
Glucose, Bld: 88 mg/dL (ref 65–99)
Potassium: 5.4 mmol/L — ABNORMAL HIGH (ref 3.5–5.1)
SODIUM: 140 mmol/L (ref 135–145)
Total Protein: 6.5 g/dL (ref 6.5–8.1)

## 2015-01-12 LAB — CBC WITH DIFFERENTIAL/PLATELET
Basophils Absolute: 0 10*3/uL (ref 0–0.1)
Basophils Relative: 0 %
Eosinophils Absolute: 0.1 10*3/uL (ref 0–0.7)
Eosinophils Relative: 1 %
HEMATOCRIT: 28 % — AB (ref 40.0–52.0)
Hemoglobin: 8.9 g/dL — ABNORMAL LOW (ref 13.0–18.0)
LYMPHS ABS: 0.6 10*3/uL — AB (ref 1.0–3.6)
Lymphocytes Relative: 7 %
MCH: 20.9 pg — AB (ref 26.0–34.0)
MCHC: 31.7 g/dL — AB (ref 32.0–36.0)
MCV: 65.7 fL — ABNORMAL LOW (ref 80.0–100.0)
MONO ABS: 0.5 10*3/uL (ref 0.2–1.0)
Monocytes Relative: 6 %
NEUTROS ABS: 7 10*3/uL — AB (ref 1.4–6.5)
NEUTROS PCT: 86 %
Platelets: 121 10*3/uL — ABNORMAL LOW (ref 150–440)
RBC: 4.26 MIL/uL — ABNORMAL LOW (ref 4.40–5.90)
RDW: 16.6 % — ABNORMAL HIGH (ref 11.5–14.5)
WBC: 8.2 10*3/uL (ref 3.8–10.6)

## 2015-01-12 LAB — BRAIN NATRIURETIC PEPTIDE: B NATRIURETIC PEPTIDE 5: 26 pg/mL (ref 0.0–100.0)

## 2015-01-12 LAB — TROPONIN I: Troponin I: <0.03 ng/mL (ref <0.031)

## 2015-01-12 MED ORDER — IPRATROPIUM-ALBUTEROL 0.5-2.5 (3) MG/3ML IN SOLN
3.0000 mL | Freq: Once | RESPIRATORY_TRACT | Status: AC
  Administered 2015-01-12: 3 mL via RESPIRATORY_TRACT
  Filled 2015-01-12: qty 3

## 2015-01-12 MED ORDER — METHYLPREDNISOLONE SODIUM SUCC 125 MG IJ SOLR
60.0000 mg | Freq: Four times a day (QID) | INTRAMUSCULAR | Status: DC
  Administered 2015-01-13 – 2015-01-15 (×11): 60 mg via INTRAVENOUS
  Filled 2015-01-12 (×11): qty 2

## 2015-01-12 MED ORDER — SODIUM POLYSTYRENE SULFONATE 15 GM/60ML PO SUSP
30.0000 g | Freq: Once | ORAL | Status: AC
  Administered 2015-01-13: 30 g via ORAL
  Filled 2015-01-12: qty 120

## 2015-01-12 MED ORDER — METHYLPREDNISOLONE SODIUM SUCC 125 MG IJ SOLR
125.0000 mg | Freq: Once | INTRAMUSCULAR | Status: AC
  Administered 2015-01-12: 125 mg via INTRAVENOUS
  Filled 2015-01-12: qty 2

## 2015-01-12 MED ORDER — SODIUM POLYSTYRENE SULFONATE 15 GM/60ML PO SUSP: 15.0000 g | Freq: Once | ORAL | Status: DC

## 2015-01-12 MED ORDER — HYDROCODONE-ACETAMINOPHEN 5-325 MG PO TABS
2.0000 | ORAL_TABLET | Freq: Once | ORAL | Status: AC
  Administered 2015-01-12: 2 via ORAL
  Filled 2015-01-12: qty 2

## 2015-01-12 NOTE — ED Provider Notes (Signed)
[facility] Emergency Department Provider Note     Time seen: ----------------------------------------- 7:52 PM on 01/12/2015 -----------------------------------------    I have reviewed the triage vital signs and the nursing notes.   HISTORY  Chief Complaint No chief complaint on file.    HPI Charles Rose is a 74 y.o. male who presents ER for severe dyspnea. Patient states been hour prior to arrival began with gasping breaths and worsening difficulty breathing. He states this happen once before but never this severe when he had a blockage in his chest. Denies any fevers or chills, states he has broken out into a sweat. Shortness of breath is severe, nothing makes it better or worse.   Past Medical History  Diagnosis Date  . Coronary artery disease   . COPD (chronic obstructive pulmonary disease)   . Hyperlipidemia   . Thrombocytopenia   . Hypertension   . Anemia   . Anxiety   . Asthma   . Cancer     esophagus    There are no active problems to display for this patient.   Past Surgical History  Procedure Laterality Date  . Coronary stent placement      Allergies Review of patient's allergies indicates no known allergies.  Social History History  Substance Use Topics  . Smoking status: Former Research scientist (life sciences)  . Smokeless tobacco: Not on file  . Alcohol Use: No    Review of Systems Constitutional: Negative for fever. Eyes: Negative for visual changes. ENT: Negative for sore throat. Cardiovascular: Negative for chest pain. Respiratory: Positive for shortness of breath Gastrointestinal: Negative for abdominal pain, vomiting and diarrhea. Genitourinary: Negative for dysuria. Musculoskeletal: Negative for back pain. Skin: Positive for diaphoresis Neurological: Negative for headaches, focal weakness or numbness.  10-point ROS otherwise negative.  ____________________________________________   PHYSICAL EXAM:  VITAL SIGNS: ED  Triage Vitals  Enc Vitals Group     BP --      Pulse --      Resp --      Temp --      Temp src --      SpO2 --      Weight --      Height --      Head Cir --      Peak Flow --      Pain Score --      Pain Loc --      Pain Edu? --      Excl. in [place]? --     Constitutional: Alert and oriented. Moderate distress Eyes: Conjunctivae are normal. PERRL. Normal extraocular movements. ENT   Head: Normocephalic and atraumatic.   Nose: No congestion/rhinnorhea.   Mouth/Throat: Mucous membranes are moist.   Neck: No stridor. Positive JVD Cardiovascular: Rapid rate, regular rhythm. Normal and symmetric distal pulses are present in all extremities.  Respiratory: Respiratory distress with retractions and tachypnea. Diminished breath sounds bilaterally with wheezing. No rales. Gastrointestinal: Soft and nontender. No distention. No abdominal bruits. Musculoskeletal: Nontender with normal range of motion in all extremities. No joint effusions.  No lower extremity tenderness nor edema. Neurologic:  Normal speech and language. No gross focal neurologic deficits are appreciated. Speech is normal. Skin:  Skin is warm, dry and intact. No rash noted. Psychiatric: Mood and affect are normal. Speech and behavior are normal. Patient exhibits appropriate insight and judgment. ____________________________________________  EKG: Interpreted by me. Sinus tachycardia with fusion complexes, right bundle branch block, rate is 112 bpm, normal PR interval, wide QRS,  borderline long QT interval. No evidence of acute infarction.  ____________________________________________  ED COURSE:  Pertinent labs & imaging results that were available during my care of the patient were reviewed by me and considered in my medical decision making (see chart for details). Patient with severe respiratory distress, chest which to BiPAP on arrival, started on DuoNeb, steroids. We'll need chest x-ray and  reevaluation. ____________________________________________    LABS (pertinent positives/negatives)  Labs Reviewed  COMPREHENSIVE METABOLIC PANEL - Abnormal; Notable for the following:    Potassium 5.4 (*)    Calcium 8.7 (*)    GFR calc non Af Amer 58 (*)    All other components within normal limits  BLOOD GAS, VENOUS - Abnormal; Notable for the following:    pCO2, Ven 43 (*)    All other components within normal limits  CULTURE, BLOOD (ROUTINE X 2)  CULTURE, BLOOD (ROUTINE X 2)  BRAIN NATRIURETIC PEPTIDE  TROPONIN I  CBC WITH DIFFERENTIAL/PLATELET    RADIOLOGY Images were viewed by me  Chest x-ray reveals chronic changes with no pneumonia  CRITICAL CARE Performed by: Earleen Newport   Total critical care time: 30 minutes  Critical care time was exclusive of separately billable procedures and treating other patients.  Critical care was necessary to treat or prevent imminent or life-threatening deterioration.  Critical care was time spent personally by me on the following activities: development of treatment plan with patient and/or surrogate as well as nursing, discussions with consultants, evaluation of patient's response to treatment, examination of patient, obtaining history from patient or surrogate, ordering and performing treatments and interventions, ordering and review of laboratory studies, ordering and review of radiographic studies, pulse oximetry and re-evaluation of patient's condition.  ____________________________________________  FINAL ASSESSMENT AND PLAN  Dyspnea, severe COPD exacerbation  Plan: Patient with labs and imaging as dictated above. Patient doing much better having been weaned off of BiPAP to just nasal cannula oxygen. Earlier he received 3 DuoNeb as well as IV steroids. His chest x-ray does not reveal any pneumonia. Unclear what cause such a severe sudden COPD exacerbation. Patient will need to be observed in the hospital to ensure  continued adequate breathing. May give him some antibiotics as well.   Earleen Newport, MD   Earleen Newport, MD 01/12/15 2117

## 2015-01-12 NOTE — ED Notes (Signed)
Pt arrived via EMS from home. Pt states about an hour ago he started having increased shortness of breath and was gasping for breath and having labored breathing. Diaphoretic as well. Patient is alert and oriented and arrived with CPAP on from EMS.  Pt oxygen sats in the 80s on 2 L home oxygen.  With CPAP patient was in the middle 90s.

## 2015-01-13 LAB — CBC
HCT: 27.6 % — ABNORMAL LOW (ref 40.0–52.0)
Hemoglobin: 8.8 g/dL — ABNORMAL LOW (ref 13.0–18.0)
MCH: 21 pg — AB (ref 26.0–34.0)
MCHC: 32 g/dL (ref 32.0–36.0)
MCV: 65.8 fL — ABNORMAL LOW (ref 80.0–100.0)
Platelets: 112 10*3/uL — ABNORMAL LOW (ref 150–440)
RBC: 4.2 MIL/uL — AB (ref 4.40–5.90)
RDW: 16.2 % — ABNORMAL HIGH (ref 11.5–14.5)
WBC: 2.8 10*3/uL — ABNORMAL LOW (ref 3.8–10.6)

## 2015-01-13 LAB — BASIC METABOLIC PANEL
ANION GAP: 10 (ref 5–15)
BUN: 17 mg/dL (ref 6–20)
CALCIUM: 8.5 mg/dL — AB (ref 8.9–10.3)
CO2: 25 mmol/L (ref 22–32)
Chloride: 105 mmol/L (ref 101–111)
Creatinine, Ser: 0.87 mg/dL (ref 0.61–1.24)
GFR calc Af Amer: >60 mL/min (ref >60)
GFR calc non Af Amer: >60 mL/min (ref >60)
GLUCOSE: 153 mg/dL — AB (ref 65–99)
POTASSIUM: 4.4 mmol/L (ref 3.5–5.1)
SODIUM: 140 mmol/L (ref 135–145)

## 2015-01-13 MED ORDER — ACETAMINOPHEN 325 MG PO TABS: 650.0000 mg | ORAL_TABLET | Freq: Four times a day (QID) | ORAL | Status: DC | PRN

## 2015-01-13 MED ORDER — ZOLPIDEM TARTRATE 5 MG PO TABS
5.0000 mg | ORAL_TABLET | Freq: Every evening | ORAL | Status: DC | PRN
  Administered 2015-01-16 (×2): 5 mg via ORAL
  Filled 2015-01-13 (×3): qty 1

## 2015-01-13 MED ORDER — ENSURE ENLIVE PO LIQD
237.0000 mL | Freq: Two times a day (BID) | ORAL | Status: DC
  Administered 2015-01-14 – 2015-01-17 (×8): 237 mL via ORAL

## 2015-01-13 MED ORDER — OXYCODONE HCL 5 MG PO TABS
5.0000 mg | ORAL_TABLET | ORAL | Status: DC | PRN
  Administered 2015-01-13 – 2015-01-16 (×6): 5 mg via ORAL
  Filled 2015-01-13 (×6): qty 1

## 2015-01-13 MED ORDER — NITROGLYCERIN 0.4 MG SL SUBL: 0.4000 mg | SUBLINGUAL_TABLET | SUBLINGUAL | Status: DC | PRN

## 2015-01-13 MED ORDER — LEVOFLOXACIN 500 MG PO TABS
500.0000 mg | ORAL_TABLET | Freq: Every day | ORAL | Status: DC
  Administered 2015-01-13: 500 mg via ORAL
  Filled 2015-01-13: qty 1

## 2015-01-13 MED ORDER — FAMOTIDINE 20 MG PO TABS
20.0000 mg | ORAL_TABLET | Freq: Every day | ORAL | Status: DC
  Administered 2015-01-13 – 2015-01-17 (×5): 20 mg via ORAL
  Filled 2015-01-13 (×5): qty 1

## 2015-01-13 MED ORDER — ACETAMINOPHEN 650 MG RE SUPP: 650.0000 mg | Freq: Four times a day (QID) | RECTAL | Status: DC | PRN

## 2015-01-13 MED ORDER — DOCUSATE SODIUM 100 MG PO CAPS
100.0000 mg | ORAL_CAPSULE | Freq: Two times a day (BID) | ORAL | Status: DC
  Administered 2015-01-13 – 2015-01-17 (×6): 100 mg via ORAL
  Filled 2015-01-13 (×8): qty 1

## 2015-01-13 MED ORDER — IPRATROPIUM-ALBUTEROL 0.5-2.5 (3) MG/3ML IN SOLN
3.0000 mL | RESPIRATORY_TRACT | Status: DC | PRN
  Administered 2015-01-13 – 2015-01-17 (×13): 3 mL via RESPIRATORY_TRACT
  Filled 2015-01-13 (×15): qty 3

## 2015-01-13 MED ORDER — ENOXAPARIN SODIUM 40 MG/0.4ML ~~LOC~~ SOLN
40.0000 mg | SUBCUTANEOUS | Status: DC
  Administered 2015-01-13 – 2015-01-17 (×5): 40 mg via SUBCUTANEOUS
  Filled 2015-01-13 (×5): qty 0.4

## 2015-01-13 MED ORDER — FERROUS SULFATE 325 (65 FE) MG PO TABS
325.0000 mg | ORAL_TABLET | Freq: Every day | ORAL | Status: DC
  Administered 2015-01-13 – 2015-01-17 (×5): 325 mg via ORAL
  Filled 2015-01-13 (×5): qty 1

## 2015-01-13 MED ORDER — POLYETHYLENE GLYCOL 3350 17 G PO PACK: 17.0000 g | PACK | Freq: Every day | ORAL | Status: DC | PRN

## 2015-01-13 MED ORDER — ALUM & MAG HYDROXIDE-SIMETH 200-200-20 MG/5ML PO SUSP
30.0000 mL | Freq: Four times a day (QID) | ORAL | Status: DC | PRN
  Administered 2015-01-14 – 2015-01-17 (×7): 30 mL via ORAL
  Filled 2015-01-13 (×7): qty 30

## 2015-01-13 MED ORDER — OMEGA-3-ACID ETHYL ESTERS 1 G PO CAPS
1.0000 g | ORAL_CAPSULE | Freq: Every day | ORAL | Status: DC
  Administered 2015-01-13 – 2015-01-17 (×5): 1 g via ORAL
  Filled 2015-01-13 (×5): qty 1

## 2015-01-13 MED ORDER — IPRATROPIUM-ALBUTEROL 0.5-2.5 (3) MG/3ML IN SOLN
3.0000 mL | RESPIRATORY_TRACT | Status: DC
  Administered 2015-01-13: 3 mL via RESPIRATORY_TRACT
  Filled 2015-01-13: qty 3

## 2015-01-13 MED ORDER — SIMVASTATIN 40 MG PO TABS
40.0000 mg | ORAL_TABLET | Freq: Every day | ORAL | Status: DC
  Administered 2015-01-13 – 2015-01-16 (×4): 40 mg via ORAL
  Filled 2015-01-13 (×4): qty 1

## 2015-01-13 MED ORDER — ISOSORBIDE MONONITRATE ER 30 MG PO TB24
30.0000 mg | ORAL_TABLET | Freq: Every day | ORAL | Status: DC
  Administered 2015-01-13 – 2015-01-17 (×5): 30 mg via ORAL
  Filled 2015-01-13 (×5): qty 1

## 2015-01-13 MED ORDER — ONDANSETRON HCL 4 MG/2ML IJ SOLN: 4.0000 mg | Freq: Four times a day (QID) | INTRAMUSCULAR | Status: DC | PRN

## 2015-01-13 MED ORDER — LEVOFLOXACIN IN D5W 750 MG/150ML IV SOLN
750.0000 mg | INTRAVENOUS | Status: DC
  Administered 2015-01-13 – 2015-01-16 (×4): 750 mg via INTRAVENOUS
  Filled 2015-01-13 (×5): qty 150

## 2015-01-13 MED ORDER — ALPRAZOLAM 0.25 MG PO TABS
0.2500 mg | ORAL_TABLET | Freq: Two times a day (BID) | ORAL | Status: DC | PRN
  Administered 2015-01-13 – 2015-01-17 (×7): 0.25 mg via ORAL
  Filled 2015-01-13 (×7): qty 1

## 2015-01-13 MED ORDER — ONDANSETRON HCL 4 MG PO TABS: 4.0000 mg | ORAL_TABLET | Freq: Four times a day (QID) | ORAL | Status: DC | PRN

## 2015-01-13 MED ORDER — ASPIRIN EC 81 MG PO TBEC
81.0000 mg | DELAYED_RELEASE_TABLET | Freq: Every day | ORAL | Status: DC
  Administered 2015-01-13 – 2015-01-17 (×5): 81 mg via ORAL
  Filled 2015-01-13 (×5): qty 1

## 2015-01-13 MED ORDER — CLOPIDOGREL BISULFATE 75 MG PO TABS
75.0000 mg | ORAL_TABLET | Freq: Every day | ORAL | Status: DC
  Administered 2015-01-13 – 2015-01-17 (×5): 75 mg via ORAL
  Filled 2015-01-13 (×5): qty 1

## 2015-01-13 NOTE — Progress Notes (Signed)
Lab called with results from blood cultures, gram positive cocci growth in aroebic bottle, Dr. Margaretmary Eddy made aware, new order for levofloxacin 750mg  QD IV.

## 2015-01-13 NOTE — Care Management (Signed)
Patient lives alone.  Has much support from his sister, brother and brother in law.  Denies problems accessing medical care or obtaining meds. He does not have a life alert-"I have a cell phone....that is enough."  He has chronic home 02 through [employer] and verbalizes understanding to have portable brought to the hospital for transport home.  He has some questions about home bipapp because in the ED it helped him so much.  Reviewed the criteria and discussed that if he wished to pursue, his pulmonologist could assist because most  Likely he would require an outpatient sleep study.  Patient says that he does not drop his 02 sats during the night below 90 .  He does not want to consider home health nurse.  Would prefer not to have anyone to come provide any services in the home

## 2015-01-13 NOTE — Plan of Care (Signed)
Pt. Arrived to unit via stretcher, general room orientation given, how to use the call bell system and ascoms,   Skin is warm and dry,  Multiple discolorations to BUE, no other skin issues noted, verifying RN, Crystal.

## 2015-01-13 NOTE — Progress Notes (Signed)
Alert and oriented, vitals stable. Currently on 2L of oxygen, which is chronic at home. Gave patient one PRN breathing treatment today upon patient request. Did have some wheezing in his lungs at that time, improved. Patient is able to ambulate into the bathroom with standby assist. Complains of chronic back pain, which he is to have an MRI for on Wednesday outpatient. Also has some neck pain that is acute from pulling it slightly a couple days ago. No other complaints or needs at this time. Will continue to monitor.

## 2015-01-13 NOTE — Progress Notes (Signed)
ANTIBIOTIC CONSULT NOTE - INITIAL  Pharmacy Consult for [facility] Indication: Acute exacerbation of COPD  No Known Allergies  Patient Measurements: Height: 5\' 9"  (175.3 cm) Weight: 152 lb 11.2 oz (69.264 kg) IBW/kg (Calculated) : 70.7 Adjusted Body Weight:   Vital Signs: Temp: 98.1 F (36.7 C) (07/25 0104) Temp Source: Oral (07/25 0104) BP: 118/76 mmHg (07/25 0104) Pulse Rate: 92 (07/25 0104) Intake/Output from previous day:   Intake/Output from this shift:    Labs:  Recent Labs  01/12/15 2002 01/12/15 2124  WBC  --  8.2  HGB  --  8.9*  PLT  --  121*  CREATININE 1.21  --    Estimated Creatinine Clearance: 54.1 mL/min (by C-G formula based on Cr of 1.21).   Microbiology: No results found for this or any previous visit (from the past 720 hour(s)).  Medical History: Past Medical History  Diagnosis Date  . Coronary artery disease   . COPD (chronic obstructive pulmonary disease)   . Hyperlipidemia   . Thrombocytopenia   . Hypertension   . Anemia   . Anxiety   . Asthma   . Cancer     esophagus    Medications:  Scheduled:  . aspirin EC  81 mg Oral Daily  . clopidogrel  75 mg Oral Daily  . docusate sodium  100 mg Oral BID  . enoxaparin (LOVENOX) injection  40 mg Subcutaneous Q24H  . famotidine  20 mg Oral Daily  . ferrous sulfate  325 mg Oral Q breakfast  . ipratropium-albuterol  3 mL Nebulization Q4H  . isosorbide mononitrate  30 mg Oral Daily  . levofloxacin  500 mg Oral Daily  . methylPREDNISolone (SOLU-MEDROL) injection  60 mg Intravenous Q6H  . omega-3 acid ethyl esters  1 g Oral Daily  . simvastatin  40 mg Oral q1800   Assessment: 74 yo male with acute exacerbation of COPD to start Levaquin per MD.  Plan:  Follow up culture results  Will begin Levaquin 500mg  PO daily.  Chinita Greenland PharmD Clinical Pharmacist 01/13/2015

## 2015-01-13 NOTE — Progress Notes (Signed)
PROGRESS NOTE  Charles Rose [facility] HCW:[id number] DOB: March 28, 1941 DOA: 01/12/2015 PCP: Tracie Harrier, MD   Charles Rose:  74  Y/o male  With hx of COPD, Chronic respiratory failure,Ca esophagus, HTN, Anemia admitted with progressive shortness of breath and a dry cough. Pt improved with IV Solumedrol and BIPAP. This am feels better      Objective: BP 137/84 mmHg  Pulse 85  Temp(Src) 98 F (36.7 C) (Oral)  Resp 18  Ht 5\' 9"  (1.753 m)  Wt 69.264 kg (152 lb 11.2 oz)  BMI 22.54 kg/m2  SpO2 98%  Intake/Output Summary (Last 24 hours) at 01/13/15 0908 Last data filed at 01/13/15 0851  Gross per 24 hour  Intake      0 ml  Output      0 ml  Net      0 ml   Filed Weights   01/12/15 2219 01/13/15 0104  Weight: 69.31 kg (152 lb 12.8 oz) 69.264 kg (152 lb 11.2 oz)    Exam: HEENT; [employer], AT  General: Pale mucosa  Cardiovascular: S1 S2  Respiratory: Decreased air entry bilat   Abdomen: Soft. Non tender  Neuro:Non Focal   Data Reviewed: Basic Metabolic Panel:  Recent Labs Lab 01/12/15 2002 01/13/15 0528  NA 140 140  K 5.4* 4.4  CL 106 105  CO2 25 25  GLUCOSE 88 153*  BUN 16 17  CREATININE 1.21 0.87  CALCIUM 8.7* 8.5*   Liver Function Tests:  Recent Labs Lab 01/12/15 2002  AST 40  ALT 19  ALKPHOS 60  BILITOT 0.8  PROT 6.5  ALBUMIN 3.9   No results for input(s): LIPASE, AMYLASE in the last 168 hours. No results for input(s): AMMONIA in the last 168 hours. CBC:  Recent Labs Lab 01/12/15 2124 01/13/15 0528  WBC 8.2 2.8*  NEUTROABS 7.0*  --   HGB 8.9* 8.8*  HCT 28.0* 27.6*  MCV 65.7* 65.8*  PLT 121* 112*   Cardiac Enzymes:    Recent Labs Lab 01/12/15 2002  TROPONINI <0.03   BNP (last 3 results)  Recent Labs  01/12/15 2021  BNP 26.0    ProBNP (last 3 results) No results for input(s): PROBNP in the last 8760 hours.  CBG: No results for input(s): GLUCAP in the last 168 hours.  Recent Results (from the past 240 hour(s))  Blood  culture (routine x 2)     Status: None (Preliminary result)   Collection Time: 01/12/15  8:03 PM  Result Value Ref Range Status   Specimen Description BLOOD LEFT ARM  Final   Special Requests BOTTLES DRAWN AEROBIC AND ANAEROBIC 5CC  Final   Culture NO GROWTH < 12 HOURS  Final   Report Status PENDING  Incomplete  Blood culture (routine x 2)     Status: None (Preliminary result)   Collection Time: 01/12/15  9:24 PM  Result Value Ref Range Status   Specimen Description BLOOD LEFT ARM  Final   Special Requests BOTTLES DRAWN AEROBIC AND ANAEROBIC 5CC  Final   Culture NO GROWTH < 12 HOURS  Final   Report Status PENDING  Incomplete     Studies: Dg Chest 1 View  01/12/2015   CLINICAL DATA:  Dyspnea, shortness of breath for 3 weeks  EXAM: CHEST  1 VIEW  COMPARISON:  08/15/2014  FINDINGS: There is mild right middle lobe and lower lobe scarring. There is no focal parenchymal opacity. There is no pleural effusion or pneumothorax. The heart and mediastinal contours are unremarkable.  The  osseous structures are unremarkable.  IMPRESSION: No active disease.   Electronically Signed   By: Kathreen Devoid   On: 01/12/2015 20:24    Scheduled Meds: . aspirin EC  81 mg Oral Daily  . clopidogrel  75 mg Oral Daily  . docusate sodium  100 mg Oral BID  . enoxaparin (LOVENOX) injection  40 mg Subcutaneous Q24H  . famotidine  20 mg Oral Daily  . ferrous sulfate  325 mg Oral Q breakfast  . isosorbide mononitrate  30 mg Oral Daily  . levofloxacin  500 mg Oral Daily  . methylPREDNISolone (SOLU-MEDROL) injection  60 mg Intravenous Q6H  . omega-3 acid ethyl esters  1 g Oral Daily  . simvastatin  40 mg Oral q1800    Continuous Infusions:   Assessment/Plan:  1 Acute Respiratory distress secondary to COPD exacerbation; On IV Solumedrol. O2  and Duonebs Pt is symptomatically better CXR: Negative for Infiltrate: On Levaquin 2 Hyperkalemia; Improved 3 CAD- hx of stent: On Plavix, Isosorbide and statins 4  Anxiety: On Xanax    Code Status: Full      Tamberlyn Midgley   01/13/2015, 9:08 AM  LOS: 1 day

## 2015-01-13 NOTE — H&P (Signed)
[facility] NAME: Charles Rose    MR#:  [id number]  DATE OF BIRTH:  [birth year]  DATE OF ADMISSION:  01/12/2015  PRIMARY CARE PHYSICIAN: Tracie Harrier, MD   REQUESTING/REFERRING PHYSICIAN: Dr. Jimmye Norman  CHIEF COMPLAINT:  Shortness of breath  HISTORY OF PRESENT ILLNESS:  Charles Rose  is a 74 y.o. male with a known history of COPD, with chronic hypoxia , oxygen dependent sees Dr. Raul Del as an outpatient is brought into the ED by EMS for severe dyspnea. The patient was reporting shortness of breath for the past 2 weeks which has been progressively  worse associated with dry cough. Today he was feeling tight and couldn't breathe and came into the ED. He was given Solu-Medrol neb laser treatments and was initially placed on BiPAP, as patient was feeling better BiPAP was weaned off and patient was placed on oxygen via nasal cannula. Denies any chest pain.  PAST MEDICAL HISTORY:   Past Medical History  Diagnosis Date  . Coronary artery disease   . COPD (chronic obstructive pulmonary disease)   . Hyperlipidemia   . Thrombocytopenia   . Hypertension   . Anemia   . Anxiety   . Asthma   . Cancer     esophagus    PAST SURGICAL HISTOIRY:   Past Surgical History  Procedure Laterality Date  . Coronary stent placement      SOCIAL HISTORY:   History  Substance Use Topics  . Smoking status: Former Research scientist (life sciences)  . Smokeless tobacco: Not on file  . Alcohol Use: No    FAMILY HISTORY:  Hypertension-father  DRUG ALLERGIES:  No Known Allergies  REVIEW OF SYSTEMS:  CONSTITUTIONAL: No fever, reporting fatigue and weakness EYES: No blurred or double vision.  EARS, NOSE, AND THROAT: No tinnitus or ear pain.  RESPIRATORY: Reporting dry cough and shortness of breath, denies wheezing or hemoptysis.  CARDIOVASCULAR: No chest pain, orthopnea, edema.  GASTROINTESTINAL: No nausea, vomiting, diarrhea or abdominal pain.   GENITOURINARY: No dysuria, hematuria.  ENDOCRINE: No polyuria, nocturia,  HEMATOLOGY: No anemia, easy bruising or bleeding SKIN: No rash or lesion. MUSCULOSKELETAL: No joint pain or arthritis.   NEUROLOGIC: No tingling, numbness, weakness.  PSYCHIATRY: No anxiety or depression.   MEDICATIONS AT HOME:   Prior to Admission medications   Medication Sig Start Date End Date Taking? Authorizing Provider  albuterol (PROVENTIL HFA;VENTOLIN HFA) 108 (90 BASE) MCG/ACT inhaler Inhale 2 puffs into the lungs every 6 (six) hours as needed for wheezing or shortness of breath.   Yes Historical Provider, MD  ALPRAZolam (XANAX) 0.25 MG tablet Take 0.25 mg by mouth 2 (two) times daily as needed for anxiety.   Yes Historical Provider, MD  aspirin 81 MG tablet Take 81 mg by mouth daily.   Yes Historical Provider, MD  clopidogrel (PLAVIX) 75 MG tablet Take 75 mg by mouth daily.   Yes Historical Provider, MD  ferrous sulfate 325 (65 FE) MG tablet Take 325 mg by mouth daily with breakfast.   Yes Historical Provider, MD  ipratropium-albuterol (DUONEB) 0.5-2.5 (3) MG/3ML SOLN Take 3 mLs by nebulization 3 (three) times daily.   Yes Historical Provider, MD  isosorbide mononitrate (IMDUR) 30 MG 24 hr tablet Take 30 mg by mouth daily.   Yes Historical Provider, MD  nitroGLYCERIN (NITROSTAT) 0.4 MG SL tablet Place 0.4 mg under the tongue every 5 (five) minutes as needed for chest pain.   Yes Historical Provider, MD  omega-3 acid  ethyl esters (LOVAZA) 1 G capsule Take 1 g by mouth daily.   Yes Historical Provider, MD  ranitidine (ZANTAC) 150 MG tablet Take 150 mg by mouth 2 (two) times daily.   Yes Historical Provider, MD  simvastatin (ZOCOR) 40 MG tablet Take 40 mg by mouth daily at 6 PM.   Yes Historical Provider, MD  levofloxacin (LEVAQUIN) 500 MG tablet Take 500 mg by mouth daily.    Historical Provider, MD  predniSONE (DELTASONE) 10 MG tablet Take 10 mg by mouth daily with breakfast. 6 PO for two days, decrease by 10  mg every 2 days until completed.    Historical Provider, MD      VITAL SIGNS:  Blood pressure 118/76, pulse 92, temperature 98.1 F (36.7 C), temperature source Oral, resp. rate 18, height 5\' 9"  (1.753 m), weight 69.264 kg (152 lb 11.2 oz), SpO2 96 %.  PHYSICAL EXAMINATION:  GENERAL:  74 y.o.-year-old patient lying in the bed with no acute distress.  EYES: Pupils equal, round, reactive to light and accommodation. No scleral icterus. Extraocular muscles intact.  HEENT: Head atraumatic, normocephalic. Oropharynx and nasopharynx clear.  NECK:  Supple, no jugular venous distention. No thyroid enlargement, no tenderness.  LUNGS: Moderate breath sounds bilaterally, no wheezing, rales,rhonchi or crepitation. No use of accessory muscles of respiration.  CARDIOVASCULAR: S1, S2 normal. No murmurs, rubs, or gallops.  ABDOMEN: Soft, nontender, nondistended. Bowel sounds present. No organomegaly or mass.  EXTREMITIES: No pedal edema, cyanosis, or clubbing.  NEUROLOGIC: Cranial nerves II through XII are intact. Muscle strength 5/5 in all extremities. Sensation intact. Gait not checked.  PSYCHIATRIC: The patient is alert and oriented x 3.  SKIN: No obvious rash, lesion, or ulcer.   LABORATORY PANEL:   CBC  Recent Labs Lab 01/12/15 2124  WBC 8.2  HGB 8.9*  HCT 28.0*  PLT 121*   ------------------------------------------------------------------------------------------------------------------  Chemistries   Recent Labs Lab 01/12/15 2002  NA 140  K 5.4*  CL 106  CO2 25  GLUCOSE 88  BUN 16  CREATININE 1.21  CALCIUM 8.7*  AST 40  ALT 19  ALKPHOS 60  BILITOT 0.8   ------------------------------------------------------------------------------------------------------------------  Cardiac Enzymes  Recent Labs Lab 01/12/15 2002  TROPONINI <0.03   ------------------------------------------------------------------------------------------------------------------  RADIOLOGY:  Dg  Chest 1 View  01/12/2015   CLINICAL DATA:  Dyspnea, shortness of breath for 3 weeks  EXAM: CHEST  1 VIEW  COMPARISON:  08/15/2014  FINDINGS: There is mild right middle lobe and lower lobe scarring. There is no focal parenchymal opacity. There is no pleural effusion or pneumothorax. The heart and mediastinal contours are unremarkable.  The osseous structures are unremarkable.  IMPRESSION: No active disease.   Electronically Signed   By: Kathreen Devoid   On: 01/12/2015 20:24    EKG:   Orders placed or performed during the hospital encounter of 01/12/15  . ED EKG  . ED EKG    IMPRESSION AND PLAN:  Charles Rose  is a 74 y.o. male with a known history of COPD, with chronic hypoxia , oxygen dependent sees Dr. Raul Del as an outpatient is brought into the ED by EMS for severe dyspnea. The patient was reporting shortness of breath for the past 2 weeks which has been progressively  worse associated with dry cough. Today he was feeling tight and couldn't breathe and came into the ED.  1. Acute respiratory distress secondary to acute exacerbation of COPD Solum Medrol 60 mg IV every 6 hours DuoNeb's every 6 hours and  albuterol every 4 hours as needed basis Consider pulmonary consult to Dr. Raul Del if no improvement clinically Provide levofloxacin  2. Hyperkalemia Kayexalate is given Check BMP in a.m.  3. Thrombus cytopenia No bleeding or bruising noticed. Monitor platelet count closely  4. History of coronary artery disease status post stent Currently patient is asymptomatic Continue his home medication aspirin, Plavix, omega-3 fatty acid, Imdur and statin  5. History of anxiety Resume his home medication  DVT prophylaxis with Lovenox subcutaneous  Patient will be transferred to Dr. Linton Ham service in a.m. All the records are reviewed and case discussed with ED provider. Management plans discussed with the patient, family and they are in agreement.  CODE STATUS: Full code, sister is the  healthcare power of attorney  TOTAL TIME TAKING CARE OF THIS PATIENT: Reviewing medical records, history and physical, admission orders and coronation of care -50 minutes.    Nicholes Mango M.D on 01/13/2015 at 2:38 AM  Between 7am to 6pm - Pager - [phone]  After 6pm go to [email or web address] - password [email or web address] [facility]  Office  [phone]  CC: Primary care physician; Tracie Harrier, MD

## 2015-01-14 ENCOUNTER — Inpatient Hospital Stay: Payer: Medicare Other

## 2015-01-14 LAB — CBC WITH DIFFERENTIAL/PLATELET
Basophils Absolute: 0 10*3/uL (ref 0–0.1)
Basophils Relative: 0 %
EOS PCT: 0 %
Eosinophils Absolute: 0 10*3/uL (ref 0–0.7)
HCT: 28 % — ABNORMAL LOW (ref 40.0–52.0)
Hemoglobin: 8.8 g/dL — ABNORMAL LOW (ref 13.0–18.0)
Lymphocytes Relative: 5 %
Lymphs Abs: 0.3 10*3/uL — ABNORMAL LOW (ref 1.0–3.6)
MCH: 20.6 pg — ABNORMAL LOW (ref 26.0–34.0)
MCHC: 31.4 g/dL — AB (ref 32.0–36.0)
MCV: 65.5 fL — ABNORMAL LOW (ref 80.0–100.0)
Monocytes Absolute: 0.2 10*3/uL (ref 0.2–1.0)
Monocytes Relative: 4 %
NEUTROS ABS: 5.5 10*3/uL (ref 1.4–6.5)
Neutrophils Relative %: 91 %
Platelets: 119 10*3/uL — ABNORMAL LOW (ref 150–440)
RBC: 4.28 MIL/uL — ABNORMAL LOW (ref 4.40–5.90)
RDW: 16.3 % — AB (ref 11.5–14.5)
WBC: 6 10*3/uL (ref 3.8–10.6)

## 2015-01-14 LAB — BASIC METABOLIC PANEL
ANION GAP: 7 (ref 5–15)
BUN: 21 mg/dL — AB (ref 6–20)
CALCIUM: 8.8 mg/dL — AB (ref 8.9–10.3)
CO2: 28 mmol/L (ref 22–32)
Chloride: 104 mmol/L (ref 101–111)
Creatinine, Ser: 0.92 mg/dL (ref 0.61–1.24)
GFR calc non Af Amer: >60 mL/min (ref >60)
Glucose, Bld: 149 mg/dL — ABNORMAL HIGH (ref 65–99)
Potassium: 4.1 mmol/L (ref 3.5–5.1)
SODIUM: 139 mmol/L (ref 135–145)

## 2015-01-14 MED ORDER — GADOBENATE DIMEGLUMINE 529 MG/ML IV SOLN
15.0000 mL | Freq: Once | INTRAVENOUS | Status: AC | PRN
  Administered 2015-01-14: 14 mL via INTRAVENOUS

## 2015-01-14 NOTE — Care Management Important Message (Signed)
Important Message  Patient Details  Name: Garmon Dehn MRN: [id number] Date of Birth: [birth year]   Medicare Important Message Given:  Yes-second notification given    Darius Bump Allmond 01/14/2015, 9:53 AM

## 2015-01-14 NOTE — Progress Notes (Signed)
PROGRESS NOTE  Edmon Magid [facility] IBB:[id number] DOB: March 17, 1941 DOA: 01/12/2015 PCP: Tracie Harrier, MD   [name]:  74  Y/o male  With hx of COPD, Chronic respiratory failure,Ca esophagus, HTN, Anemia admitted with progressive shortness of breath and a dry cough. Pt improved with IV Solumedrol and BIPAP. This am had episode of chest pain releived with Zantac. Also more short of breath with increased wheezing      Objective: BP 115/68 mmHg  Pulse 78  Temp(Src) 97.6 F (36.4 C) (Oral)  Resp 23  Ht 5\' 9"  (1.753 m)  Wt 69.264 kg (152 lb 11.2 oz)  BMI 22.54 kg/m2  SpO2 95%  Intake/Output Summary (Last 24 hours) at 01/14/15 0831 Last data filed at 01/13/15 2336  Gross per 24 hour  Intake    510 ml  Output    550 ml  Net    -40 ml   Filed Weights   01/12/15 2219 01/13/15 0104  Weight: 69.31 kg (152 lb 12.8 oz) 69.264 kg (152 lb 11.2 oz)    Exam: HEENT; [place], AT Pt is dyspneic at rest and using accessory muscles of respiration  General: Pale mucosa  Cardiovascular: S1 S2  Respiratory: Bilat inspiratory and expiratory wheezes +  Abdomen: Soft. Non tender  Neuro:Non Focal   Data Reviewed: Basic Metabolic Panel:  Recent Labs Lab 01/12/15 2002 01/13/15 0528 01/14/15 0459  NA 140 140 139  K 5.4* 4.4 4.1  CL 106 105 104  CO2 25 25 28   GLUCOSE 88 153* 149*  BUN 16 17 21*  CREATININE 1.21 0.87 0.92  CALCIUM 8.7* 8.5* 8.8*   Liver Function Tests:  Recent Labs Lab 01/12/15 2002  AST 40  ALT 19  ALKPHOS 60  BILITOT 0.8  PROT 6.5  ALBUMIN 3.9   No results for input(s): LIPASE, AMYLASE in the last 168 hours. No results for input(s): AMMONIA in the last 168 hours. CBC:  Recent Labs Lab 01/12/15 2124 01/13/15 0528 01/14/15 0459  WBC 8.2 2.8* 6.0  NEUTROABS 7.0*  --  5.5  HGB 8.9* 8.8* 8.8*  HCT 28.0* 27.6* 28.0*  MCV 65.7* 65.8* 65.5*  PLT 121* 112* 119*   Cardiac Enzymes:    Recent Labs Lab 01/12/15 2002  TROPONINI <0.03   BNP  (last 3 results)  Recent Labs  01/12/15 2021  BNP 26.0    ProBNP (last 3 results) No results for input(s): PROBNP in the last 8760 hours.  CBG: No results for input(s): GLUCAP in the last 168 hours.  Recent Results (from the past 240 hour(s))  Blood culture (routine x 2)     Status: None (Preliminary result)   Collection Time: 01/12/15  8:03 PM  Result Value Ref Range Status   Specimen Description BLOOD LEFT ARM  Final   Special Requests BOTTLES DRAWN AEROBIC AND ANAEROBIC 5CC  Final   Culture NO GROWTH 2 DAYS  Final   Report Status PENDING  Incomplete  Blood culture (routine x 2)     Status: None (Preliminary result)   Collection Time: 01/12/15  9:24 PM  Result Value Ref Range Status   Specimen Description BLOOD LEFT ARM  Final   Special Requests BOTTLES DRAWN AEROBIC AND ANAEROBIC 5CC  Final   Culture  Setup Time   Final    GRAM POSITIVE COCCI AEROBIC BOTTLE ONLY CRITICAL RESULT CALLED TO, READ BACK BY AND VERIFIED WITH: CALLED TO LISA ROMERO @ 2219 ON 01/13/2015 [facility] CONFIRMED BY [facility]    Culture   Final  COAGULASE NEGATIVE STAPHYLOCOCCUS AEROBIC BOTTLE ONLY POSSIBLE CONTAMINATION WITH SKIN FLORA    Report Status PENDING  Incomplete     Studies: No results found.  Scheduled Meds: . aspirin EC  81 mg Oral Daily  . clopidogrel  75 mg Oral Daily  . docusate sodium  100 mg Oral BID  . enoxaparin (LOVENOX) injection  40 mg Subcutaneous Q24H  . famotidine  20 mg Oral Daily  . feeding supplement (ENSURE ENLIVE)  237 mL Oral BID BM  . ferrous sulfate  325 mg Oral Q breakfast  . isosorbide mononitrate  30 mg Oral Daily  . levofloxacin (LEVAQUIN) IV  750 mg Intravenous Q24H  . methylPREDNISolone (SOLU-MEDROL) injection  60 mg Intravenous Q6H  . omega-3 acid ethyl esters  1 g Oral Daily  . simvastatin  40 mg Oral q1800    Continuous Infusions:   Assessment/Plan:  1 Acute Respiratory distress secondary to COPD exacerbation; Continue  IV Solumedrol. O2  and  Duonebs Stat Treatment with SVN now  Pt is symptomatically better CXR: Negative for Infiltrate: On Levaquin 2 Hyperkalemia; Improved 3 CAD- hx of stent: On Plavix, Isosorbide and statins 4 Anxiety: On Xanax  5 GERD; On Famotidine    Code Status: Full      Claudine Stallings   01/14/2015, 8:31 AM  LOS: 2 days

## 2015-01-14 NOTE — Progress Notes (Signed)
Patient very dyspneic with any sort of movement.  He will begin wheezing and can't catch his breath.  Given duoneb and patient able to calm down enough to get his breathe back.  Encouraged to not talk will having these episodes and practice deep breathing (in through nose, out through mouth).

## 2015-01-14 NOTE — Progress Notes (Signed)
ANTIBIOTIC CONSULT NOTE - INITIAL  Pharmacy Consult for [facility] Indication: Acute exacerbation of COPD  No Known Allergies  Patient Measurements: Height: 5\' 9"  (175.3 cm) Weight: 152 lb 11.2 oz (69.264 kg) IBW/kg (Calculated) : 70.7 Adjusted Body Weight:   Vital Signs: Temp: 97.6 F (36.4 C) (07/26 0510) Temp Source: Oral (07/26 0510) BP: 115/68 mmHg (07/26 0510) Pulse Rate: 78 (07/26 0510) Intake/Output from previous day: 07/25 0701 - 07/26 0700 In: 510 [P.O.:360; IV Piggyback:150] Out: 550 [Urine:550] Intake/Output from this shift:    Labs:  Recent Labs  01/12/15 2002 01/12/15 2124 01/13/15 0528 01/14/15 0459  WBC  --  8.2 2.8* 6.0  HGB  --  8.9* 8.8* 8.8*  PLT  --  121* 112* 119*  CREATININE 1.21  --  0.87 0.92   Estimated Creatinine Clearance: 71.1 mL/min (by C-G formula based on Cr of 0.92).   Microbiology: Recent Results (from the past 720 hour(s))  Blood culture (routine x 2)     Status: None (Preliminary result)   Collection Time: 01/12/15  8:03 PM  Result Value Ref Range Status   Specimen Description BLOOD LEFT ARM  Final   Special Requests BOTTLES DRAWN AEROBIC AND ANAEROBIC 5CC  Final   Culture NO GROWTH 2 DAYS  Final   Report Status PENDING  Incomplete  Blood culture (routine x 2)     Status: None (Preliminary result)   Collection Time: 01/12/15  9:24 PM  Result Value Ref Range Status   Specimen Description BLOOD LEFT ARM  Final   Special Requests BOTTLES DRAWN AEROBIC AND ANAEROBIC 5CC  Final   Culture  Setup Time   Final    GRAM POSITIVE COCCI AEROBIC BOTTLE ONLY CRITICAL RESULT CALLED TO, READ BACK BY AND VERIFIED WITH: CALLED TO LISA ROMERO @ 2219 ON 01/13/2015 [facility] CONFIRMED BY [facility]    Culture   Final    COAGULASE NEGATIVE STAPHYLOCOCCUS AEROBIC BOTTLE ONLY POSSIBLE CONTAMINATION WITH SKIN FLORA    Report Status PENDING  Incomplete    Medical History: Past Medical History  Diagnosis Date  . Coronary artery disease   . COPD  (chronic obstructive pulmonary disease)   . Hyperlipidemia   . Thrombocytopenia   . Hypertension   . Anemia   . Anxiety   . Asthma   . Cancer     esophagus    Medications:  Scheduled:  . aspirin EC  81 mg Oral Daily  . clopidogrel  75 mg Oral Daily  . docusate sodium  100 mg Oral BID  . enoxaparin (LOVENOX) injection  40 mg Subcutaneous Q24H  . famotidine  20 mg Oral Daily  . feeding supplement (ENSURE ENLIVE)  237 mL Oral BID BM  . ferrous sulfate  325 mg Oral Q breakfast  . isosorbide mononitrate  30 mg Oral Daily  . levofloxacin (LEVAQUIN) IV  750 mg Intravenous Q24H  . methylPREDNISolone (SOLU-MEDROL) injection  60 mg Intravenous Q6H  . omega-3 acid ethyl esters  1 g Oral Daily  . simvastatin  40 mg Oral q1800   Assessment: 74 yo male with acute exacerbation of COPD to start Levaquin per MD.  Plan:  Follow up culture results  Will continue current orders for levofloxacin 750 mg IV q24h.    Rayna Sexton, PharmD, BCPS Clinical Pharmacist 01/14/2015

## 2015-01-15 ENCOUNTER — Inpatient Hospital Stay: Admission: RE | Admit: 2015-01-15 | Payer: Medicare Other | Source: Ambulatory Visit

## 2015-01-15 LAB — COMPREHENSIVE METABOLIC PANEL
ALT: 18 U/L (ref 17–63)
AST: 22 U/L (ref 15–41)
Albumin: 3.3 g/dL — ABNORMAL LOW (ref 3.5–5.0)
Alkaline Phosphatase: 55 U/L (ref 38–126)
Anion gap: 6 (ref 5–15)
BUN: 31 mg/dL — ABNORMAL HIGH (ref 6–20)
CALCIUM: 8.9 mg/dL (ref 8.9–10.3)
CO2: 31 mmol/L (ref 22–32)
Chloride: 107 mmol/L (ref 101–111)
Creatinine, Ser: 0.95 mg/dL (ref 0.61–1.24)
GFR calc Af Amer: >60 mL/min (ref >60)
GFR calc non Af Amer: >60 mL/min (ref >60)
Glucose, Bld: 137 mg/dL — ABNORMAL HIGH (ref 65–99)
POTASSIUM: 4.1 mmol/L (ref 3.5–5.1)
Sodium: 144 mmol/L (ref 135–145)
Total Bilirubin: 0.6 mg/dL (ref 0.3–1.2)
Total Protein: 5.6 g/dL — ABNORMAL LOW (ref 6.5–8.1)

## 2015-01-15 LAB — CBC WITH DIFFERENTIAL/PLATELET
BASOS ABS: 0 10*3/uL (ref 0–0.1)
Basophils Relative: 0 %
EOS PCT: 0 %
Eosinophils Absolute: 0 10*3/uL (ref 0–0.7)
HCT: 26.4 % — ABNORMAL LOW (ref 40.0–52.0)
Hemoglobin: 8.3 g/dL — ABNORMAL LOW (ref 13.0–18.0)
Lymphocytes Relative: 5 %
Lymphs Abs: 0.3 10*3/uL — ABNORMAL LOW (ref 1.0–3.6)
MCH: 20.6 pg — AB (ref 26.0–34.0)
MCHC: 31.5 g/dL — ABNORMAL LOW (ref 32.0–36.0)
MCV: 65.6 fL — AB (ref 80.0–100.0)
MONO ABS: 0.2 10*3/uL (ref 0.2–1.0)
Monocytes Relative: 3 %
NEUTROS PCT: 92 %
Neutro Abs: 4.9 10*3/uL (ref 1.4–6.5)
PLATELETS: 117 10*3/uL — AB (ref 150–440)
RBC: 4.02 MIL/uL — AB (ref 4.40–5.90)
RDW: 16.2 % — ABNORMAL HIGH (ref 11.5–14.5)
WBC: 5.4 10*3/uL (ref 3.8–10.6)

## 2015-01-15 MED ORDER — METHYLPREDNISOLONE SODIUM SUCC 40 MG IJ SOLR
40.0000 mg | Freq: Three times a day (TID) | INTRAMUSCULAR | Status: DC
  Administered 2015-01-15 – 2015-01-17 (×6): 40 mg via INTRAVENOUS
  Filled 2015-01-15 (×5): qty 1

## 2015-01-15 NOTE — Care Management (Signed)
Spoke with attending of patient's decline for any home health services.  Patient has been up in his room without assist.  Discussed the possibility of havnig attending speak directly with patient regarding post discharge services.  Attending says he will order physical therapy and see what is recommended.  If patient does accept home health, would recommend SN PT and OT- for energy conservatoin

## 2015-01-15 NOTE — Progress Notes (Signed)
PROGRESS NOTE  Kaion Tisdale [facility] OMV:[id number] DOB: [birth year] DOA: 01/12/2015 PCP: Tracie Harrier, MD   [name]:  74  Y/o male  With hx of COPD, Chronic respiratory failure,Ca esophagus, HTN, Anemia admitted with progressive shortness of breath and a dry cough. Pt improved with IV Solumedrol and BIPAP. This am appears to be doing better     Objective: BP 129/89 mmHg  Pulse 78  Temp(Src) 97.8 F (36.6 C) (Oral)  Resp 18  Ht 5\' 9"  (1.753 m)  Wt 69.264 kg (152 lb 11.2 oz)  BMI 22.54 kg/m2  SpO2 93%  Intake/Output Summary (Last 24 hours) at 01/15/15 0820 Last data filed at 01/15/15 0503  Gross per 24 hour  Intake    240 ml  Output    200 ml  Net     40 ml   Filed Weights   01/12/15 2219 01/13/15 0104  Weight: 69.31 kg (152 lb 12.8 oz) 69.264 kg (152 lb 11.2 oz)    Exam: HEENT; [place], AT Pt is dyspneic at rest and using accessory muscles of respiration  General: Pale mucosa  Cardiovascular: S1 S2  Respiratory: Bilat inspiratory and expiratory wheezes +  Abdomen: Soft. Non tender  Neuro:Non Focal   Data Reviewed: Basic Metabolic Panel:  Recent Labs Lab 01/12/15 2002 01/13/15 0528 01/14/15 0459 01/15/15 0424  NA 140 140 139 144  K 5.4* 4.4 4.1 4.1  CL 106 105 104 107  CO2 25 25 28 31   GLUCOSE 88 153* 149* 137*  BUN 16 17 21* 31*  CREATININE 1.21 0.87 0.92 0.95  CALCIUM 8.7* 8.5* 8.8* 8.9   Liver Function Tests:  Recent Labs Lab 01/12/15 2002 01/15/15 0424  AST 40 22  ALT 19 18  ALKPHOS 60 55  BILITOT 0.8 0.6  PROT 6.5 5.6*  ALBUMIN 3.9 3.3*   No results for input(s): LIPASE, AMYLASE in the last 168 hours. No results for input(s): AMMONIA in the last 168 hours. CBC:  Recent Labs Lab 01/12/15 2124 01/13/15 0528 01/14/15 0459 01/15/15 0424  WBC 8.2 2.8* 6.0 5.4  NEUTROABS 7.0*  --  5.5 4.9  HGB 8.9* 8.8* 8.8* 8.3*  HCT 28.0* 27.6* 28.0* 26.4*  MCV 65.7* 65.8* 65.5* 65.6*  PLT 121* 112* 119* 117*   Cardiac Enzymes:     Recent Labs Lab 01/12/15 2002  TROPONINI <0.03   BNP (last 3 results)  Recent Labs  01/12/15 2021  BNP 26.0    ProBNP (last 3 results) No results for input(s): PROBNP in the last 8760 hours.  CBG: No results for input(s): GLUCAP in the last 168 hours.  Recent Results (from the past 240 hour(s))  Blood culture (routine x 2)     Status: None (Preliminary result)   Collection Time: 01/12/15  8:03 PM  Result Value Ref Range Status   Specimen Description BLOOD LEFT ARM  Final   Special Requests BOTTLES DRAWN AEROBIC AND ANAEROBIC 5CC  Final   Culture NO GROWTH 2 DAYS  Final   Report Status PENDING  Incomplete  Blood culture (routine x 2)     Status: None (Preliminary result)   Collection Time: 01/12/15  9:24 PM  Result Value Ref Range Status   Specimen Description BLOOD LEFT ARM  Final   Special Requests BOTTLES DRAWN AEROBIC AND ANAEROBIC 5CC  Final   Culture  Setup Time   Final    GRAM POSITIVE COCCI AEROBIC BOTTLE ONLY CRITICAL RESULT CALLED TO, READ BACK BY AND VERIFIED WITH: CALLED TO LISA ROMERO @  2219 ON 01/13/2015 CAF CONFIRMED BY [facility]    Culture   Final    COAGULASE NEGATIVE STAPHYLOCOCCUS AEROBIC BOTTLE ONLY POSSIBLE CONTAMINATION WITH SKIN FLORA    Report Status PENDING  Incomplete     Studies: Mr Cervical Spine W Wo Contrast  01/14/2015   CLINICAL DATA:  Neck pain and upper back pain. Coughing. History of esophageal cancer.  EXAM: MRI CERVICAL SPINE WITHOUT AND WITH CONTRAST; MRI THORACIC SPINE WITHOUT AND WITH CONTRAST  TECHNIQUE: Multiplanar and multiecho pulse sequences of the cervical spine, to include the craniocervical junction and cervicothoracic junction, were obtained according to standard protocol without and with intravenous contrast.; Multiplanar and multiecho pulse sequences of the thoracic spine were obtained without and with intravenous contrast.  CONTRAST:  92mL MULTIHANCE GADOBENATE DIMEGLUMINE 529 MG/ML IV SOLN  COMPARISON:  CT chest  01/01/2015.  CTA neck 06/26/2014.  FINDINGS: MR cervical FINDINGS:  Alignment: Significant anterolisthesis C3-4 of 4 mm is chronic, unchanged from prior prior CTA. Slight retrolisthesis C5-6 also chronic.  Vertebrae: No worrisome osseous lesions.  Cord: Severe cord compression at C3-4 is multifactorial related to slip and disc material described below. Moderate cord compression at C4-5 and C5-6 is multifactorial as well. No abnormal cord signal at any level.  Posterior Fossa: Unremarkable.  Vertebral Arteries: Patent and equal.  Paraspinal tissues: Unremarkable.  Disc levels:  The individual disc spaces were examined as follows:  C2-3: Central protrusion. Slight effacement anterior subarachnoid space but no definite impingement.  C3-4: Moderate cord compression due to slip and central extrusion. Canal diameter 5 mm. BILATERAL C4 nerve root impingement.  C4-5: Central protrusion. Moderate cord flattening. LEFT greater than RIGHT C5 nerve root impingement.  C5-6: 2 mm retrolisthesis. Central disc osteophyte complex. Mild cord flattening. Canal diameter 6 mm. BILATERAL C6 nerve root impingement.  C6-7:  Disc space narrowing but no stenosis or neural impingement.  C7-T1:  Unremarkable.  MR thoracic FINDINGS:  Chronic compression deformity of L1. Abnormal bone marrow edema at in T7 and T11 is observed with mild compression. While there is more involvement of the entire T7 vertebral body than there is at T11 where there is primarily superior endplate depression, there is no posterior element involvement or other osseous lesions. The degree of compression is approximately similar to that noted on prior CT chest 2 weeks ago. Slight sclerosis at T11 as seen on CT may reflect a slightly older fracture, but the sclerosis does not correspond to areas of abnormal MR enhancement to a significant degree. Benign posttraumatic compression deformities are favored but metastatic disease cannot completely be excluded. If vertebral  augmentations performed, recommend bone biopsy.  No definite destructive osseous lesions are seen. There is no epidural tumor. No abnormal enhancing lesions in the spinal canal or within the cord. No paravertebral masses. Trace BILATERAL pleural effusions. No significant disc protrusion. Slight chronic retrolisthesis superior endplate L1.  IMPRESSION: Severe multilevel cervical spondylosis as described. Spinal stenosis with cord compression is observed at C3-4, C4-5, and C5-6. No abnormal cord signal at any level.  No cervical spine metastatic disease is evident.  Enhancing compression deformities at T7 and T11. These are favored to be posttraumatic/ osteopenic related although metastatic disease not completely excluded. Similar healed compression deformity helpful L1 appears similar. Continued surveillance is warranted.   Electronically Signed   By: Staci Righter M.D.   On: 01/14/2015 18:03   Mr Thoracic Spine W Wo Contrast  01/14/2015   CLINICAL DATA:  Neck pain and upper back pain. Coughing.  History of esophageal cancer.  EXAM: MRI CERVICAL SPINE WITHOUT AND WITH CONTRAST; MRI THORACIC SPINE WITHOUT AND WITH CONTRAST  TECHNIQUE: Multiplanar and multiecho pulse sequences of the cervical spine, to include the craniocervical junction and cervicothoracic junction, were obtained according to standard protocol without and with intravenous contrast.; Multiplanar and multiecho pulse sequences of the thoracic spine were obtained without and with intravenous contrast.  CONTRAST:  23mL MULTIHANCE GADOBENATE DIMEGLUMINE 529 MG/ML IV SOLN  COMPARISON:  CT chest 01/01/2015.  CTA neck 06/26/2014.  FINDINGS: MR cervical FINDINGS:  Alignment: Significant anterolisthesis C3-4 of 4 mm is chronic, unchanged from prior prior CTA. Slight retrolisthesis C5-6 also chronic.  Vertebrae: No worrisome osseous lesions.  Cord: Severe cord compression at C3-4 is multifactorial related to slip and disc material described below. Moderate cord  compression at C4-5 and C5-6 is multifactorial as well. No abnormal cord signal at any level.  Posterior Fossa: Unremarkable.  Vertebral Arteries: Patent and equal.  Paraspinal tissues: Unremarkable.  Disc levels:  The individual disc spaces were examined as follows:  C2-3: Central protrusion. Slight effacement anterior subarachnoid space but no definite impingement.  C3-4: Moderate cord compression due to slip and central extrusion. Canal diameter 5 mm. BILATERAL C4 nerve root impingement.  C4-5: Central protrusion. Moderate cord flattening. LEFT greater than RIGHT C5 nerve root impingement.  C5-6: 2 mm retrolisthesis. Central disc osteophyte complex. Mild cord flattening. Canal diameter 6 mm. BILATERAL C6 nerve root impingement.  C6-7:  Disc space narrowing but no stenosis or neural impingement.  C7-T1:  Unremarkable.  MR thoracic FINDINGS:  Chronic compression deformity of L1. Abnormal bone marrow edema at in T7 and T11 is observed with mild compression. While there is more involvement of the entire T7 vertebral body than there is at T11 where there is primarily superior endplate depression, there is no posterior element involvement or other osseous lesions. The degree of compression is approximately similar to that noted on prior CT chest 2 weeks ago. Slight sclerosis at T11 as seen on CT may reflect a slightly older fracture, but the sclerosis does not correspond to areas of abnormal MR enhancement to a significant degree. Benign posttraumatic compression deformities are favored but metastatic disease cannot completely be excluded. If vertebral augmentations performed, recommend bone biopsy.  No definite destructive osseous lesions are seen. There is no epidural tumor. No abnormal enhancing lesions in the spinal canal or within the cord. No paravertebral masses. Trace BILATERAL pleural effusions. No significant disc protrusion. Slight chronic retrolisthesis superior endplate L1.  IMPRESSION: Severe multilevel  cervical spondylosis as described. Spinal stenosis with cord compression is observed at C3-4, C4-5, and C5-6. No abnormal cord signal at any level.  No cervical spine metastatic disease is evident.  Enhancing compression deformities at T7 and T11. These are favored to be posttraumatic/ osteopenic related although metastatic disease not completely excluded. Similar healed compression deformity helpful L1 appears similar. Continued surveillance is warranted.   Electronically Signed   By: Staci Righter M.D.   On: 01/14/2015 18:03    Scheduled Meds: . aspirin EC  81 mg Oral Daily  . clopidogrel  75 mg Oral Daily  . docusate sodium  100 mg Oral BID  . enoxaparin (LOVENOX) injection  40 mg Subcutaneous Q24H  . famotidine  20 mg Oral Daily  . feeding supplement (ENSURE ENLIVE)  237 mL Oral BID BM  . ferrous sulfate  325 mg Oral Q breakfast  . isosorbide mononitrate  30 mg Oral Daily  . levofloxacin (LEVAQUIN)  IV  750 mg Intravenous Q24H  . methylPREDNISolone (SOLU-MEDROL) injection  60 mg Intravenous Q6H  . omega-3 acid ethyl esters  1 g Oral Daily  . simvastatin  40 mg Oral q1800    Continuous Infusions:   Assessment/Plan:  1 Acute Respiratory distress secondary to COPD exacerbation; Continue  IV Solumedrol. O2  and Duonebs Stat Treatment with SVN now  Pt is symptomatically better CXR: Negative for Infiltrate: On Levaquin 2 Hyperkalemia; Improved 3 CAD- hx of stent: On Plavix, Isosorbide and statins 4 Anxiety: On Xanax  5 GERD; On Famotidine    Code Status: Full      Khalani Novoa   01/15/2015, 8:20 AM  LOS: 3 days               PROGRESS NOTE  Alyx Gee GGY:[id number] DOB: [birth year] DOA: 01/12/2015 PCP: Tracie Harrier, MD   Subjctive:  74  Y/o male  With hx of COPD, Chronic respiratory failure,Ca esophagus, HTN, Anemia admitted with progressive shortness of breath and a dry cough. Pt improved with IV Solumedrol and BIPAP. This am had episode of  chest pain releived with Zantac. Also more short of breath with increased wheezing      Objective: BP 129/89 mmHg  Pulse 78  Temp(Src) 97.8 F (36.6 C) (Oral)  Resp 18  Ht 5\' 9"  (1.753 m)  Wt 69.264 kg (152 lb 11.2 oz)  BMI 22.54 kg/m2  SpO2 93%  Intake/Output Summary (Last 24 hours) at 01/15/15 0820 Last data filed at 01/15/15 0503  Gross per 24 hour  Intake    240 ml  Output    200 ml  Net     40 ml   Filed Weights   01/12/15 2219 01/13/15 0104  Weight: 69.31 kg (152 lb 12.8 oz) 69.264 kg (152 lb 11.2 oz)    Exam: HEENT; [place], AT Pt is dyspneic at rest and using accessory muscles of respiration  General: Pale mucosa  Cardiovascular: S1 S2  Respiratory: Bilat inspiratory and expiratory wheezes +  Abdomen: Soft. Non tender  Neuro:Non Focal   Data Reviewed: Basic Metabolic Panel:  Recent Labs Lab 01/12/15 2002 01/13/15 0528 01/14/15 0459 01/15/15 0424  NA 140 140 139 144  K 5.4* 4.4 4.1 4.1  CL 106 105 104 107  CO2 25 25 28 31   GLUCOSE 88 153* 149* 137*  BUN 16 17 21* 31*  CREATININE 1.21 0.87 0.92 0.95  CALCIUM 8.7* 8.5* 8.8* 8.9   Liver Function Tests:  Recent Labs Lab 01/12/15 2002 01/15/15 0424  AST 40 22  ALT 19 18  ALKPHOS 60 55  BILITOT 0.8 0.6  PROT 6.5 5.6*  ALBUMIN 3.9 3.3*   No results for input(s): LIPASE, AMYLASE in the last 168 hours. No results for input(s): AMMONIA in the last 168 hours. CBC:  Recent Labs Lab 01/12/15 2124 01/13/15 0528 01/14/15 0459 01/15/15 0424  WBC 8.2 2.8* 6.0 5.4  NEUTROABS 7.0*  --  5.5 4.9  HGB 8.9* 8.8* 8.8* 8.3*  HCT 28.0* 27.6* 28.0* 26.4*  MCV 65.7* 65.8* 65.5* 65.6*  PLT 121* 112* 119* 117*   Cardiac Enzymes:    Recent Labs Lab 01/12/15 2002  TROPONINI <0.03   BNP (last 3 results)  Recent Labs  01/12/15 2021  BNP 26.0    ProBNP (last 3 results) No results for input(s): PROBNP in the last 8760 hours.  CBG: No results for input(s): GLUCAP in the last 168  hours.  Recent Results (from the past 240 hour(s))  Blood culture (routine x 2)     Status: None (Preliminary result)   Collection Time: 01/12/15  8:03 PM  Result Value Ref Range Status   Specimen Description BLOOD LEFT ARM  Final   Special Requests BOTTLES DRAWN AEROBIC AND ANAEROBIC 5CC  Final   Culture NO GROWTH 2 DAYS  Final   Report Status PENDING  Incomplete  Blood culture (routine x 2)     Status: None (Preliminary result)   Collection Time: 01/12/15  9:24 PM  Result Value Ref Range Status   Specimen Description BLOOD LEFT ARM  Final   Special Requests BOTTLES DRAWN AEROBIC AND ANAEROBIC 5CC  Final   Culture  Setup Time   Final    GRAM POSITIVE COCCI AEROBIC BOTTLE ONLY CRITICAL RESULT CALLED TO, READ BACK BY AND VERIFIED WITH: CALLED TO LISA ROMERO @ 2219 ON 01/13/2015 CAF CONFIRMED BY [facility]    Culture   Final    COAGULASE NEGATIVE STAPHYLOCOCCUS AEROBIC BOTTLE ONLY POSSIBLE CONTAMINATION WITH SKIN FLORA    Report Status PENDING  Incomplete     Studies: Mr Cervical Spine W Wo Contrast  01/14/2015   CLINICAL DATA:  Neck pain and upper back pain. Coughing. History of esophageal cancer.  EXAM: MRI CERVICAL SPINE WITHOUT AND WITH CONTRAST; MRI THORACIC SPINE WITHOUT AND WITH CONTRAST  TECHNIQUE: Multiplanar and multiecho pulse sequences of the cervical spine, to include the craniocervical junction and cervicothoracic junction, were obtained according to standard protocol without and with intravenous contrast.; Multiplanar and multiecho pulse sequences of the thoracic spine were obtained without and with intravenous contrast.  CONTRAST:  93mL MULTIHANCE GADOBENATE DIMEGLUMINE 529 MG/ML IV SOLN  COMPARISON:  CT chest 01/01/2015.  CTA neck 06/26/2014.  FINDINGS: MR cervical FINDINGS:  Alignment: Significant anterolisthesis C3-4 of 4 mm is chronic, unchanged from prior prior CTA. Slight retrolisthesis C5-6 also chronic.  Vertebrae: No worrisome osseous lesions.  Cord: Severe cord  compression at C3-4 is multifactorial related to slip and disc material described below. Moderate cord compression at C4-5 and C5-6 is multifactorial as well. No abnormal cord signal at any level.  Posterior Fossa: Unremarkable.  Vertebral Arteries: Patent and equal.  Paraspinal tissues: Unremarkable.  Disc levels:  The individual disc spaces were examined as follows:  C2-3: Central protrusion. Slight effacement anterior subarachnoid space but no definite impingement.  C3-4: Moderate cord compression due to slip and central extrusion. Canal diameter 5 mm. BILATERAL C4 nerve root impingement.  C4-5: Central protrusion. Moderate cord flattening. LEFT greater than RIGHT C5 nerve root impingement.  C5-6: 2 mm retrolisthesis. Central disc osteophyte complex. Mild cord flattening. Canal diameter 6 mm. BILATERAL C6 nerve root impingement.  C6-7:  Disc space narrowing but no stenosis or neural impingement.  C7-T1:  Unremarkable.  MR thoracic FINDINGS:  Chronic compression deformity of L1. Abnormal bone marrow edema at in T7 and T11 is observed with mild compression. While there is more involvement of the entire T7 vertebral body than there is at T11 where there is primarily superior endplate depression, there is no posterior element involvement or other osseous lesions. The degree of compression is approximately similar to that noted on prior CT chest 2 weeks ago. Slight sclerosis at T11 as seen on CT may reflect a slightly older fracture, but the sclerosis does not correspond to areas of abnormal MR enhancement to a significant degree. Benign posttraumatic compression deformities are favored but metastatic disease cannot completely be excluded. If vertebral augmentations performed, recommend bone biopsy.  No definite destructive  osseous lesions are seen. There is no epidural tumor. No abnormal enhancing lesions in the spinal canal or within the cord. No paravertebral masses. Trace BILATERAL pleural effusions. No  significant disc protrusion. Slight chronic retrolisthesis superior endplate L1.  IMPRESSION: Severe multilevel cervical spondylosis as described. Spinal stenosis with cord compression is observed at C3-4, C4-5, and C5-6. No abnormal cord signal at any level.  No cervical spine metastatic disease is evident.  Enhancing compression deformities at T7 and T11. These are favored to be posttraumatic/ osteopenic related although metastatic disease not completely excluded. Similar healed compression deformity helpful L1 appears similar. Continued surveillance is warranted.   Electronically Signed   By: Staci Righter M.D.   On: 01/14/2015 18:03   Mr Thoracic Spine W Wo Contrast  01/14/2015   CLINICAL DATA:  Neck pain and upper back pain. Coughing. History of esophageal cancer.  EXAM: MRI CERVICAL SPINE WITHOUT AND WITH CONTRAST; MRI THORACIC SPINE WITHOUT AND WITH CONTRAST  TECHNIQUE: Multiplanar and multiecho pulse sequences of the cervical spine, to include the craniocervical junction and cervicothoracic junction, were obtained according to standard protocol without and with intravenous contrast.; Multiplanar and multiecho pulse sequences of the thoracic spine were obtained without and with intravenous contrast.  CONTRAST:  58mL MULTIHANCE GADOBENATE DIMEGLUMINE 529 MG/ML IV SOLN  COMPARISON:  CT chest 01/01/2015.  CTA neck 06/26/2014.  FINDINGS: MR cervical FINDINGS:  Alignment: Significant anterolisthesis C3-4 of 4 mm is chronic, unchanged from prior prior CTA. Slight retrolisthesis C5-6 also chronic.  Vertebrae: No worrisome osseous lesions.  Cord: Severe cord compression at C3-4 is multifactorial related to slip and disc material described below. Moderate cord compression at C4-5 and C5-6 is multifactorial as well. No abnormal cord signal at any level.  Posterior Fossa: Unremarkable.  Vertebral Arteries: Patent and equal.  Paraspinal tissues: Unremarkable.  Disc levels:  The individual disc spaces were examined as  follows:  C2-3: Central protrusion. Slight effacement anterior subarachnoid space but no definite impingement.  C3-4: Moderate cord compression due to slip and central extrusion. Canal diameter 5 mm. BILATERAL C4 nerve root impingement.  C4-5: Central protrusion. Moderate cord flattening. LEFT greater than RIGHT C5 nerve root impingement.  C5-6: 2 mm retrolisthesis. Central disc osteophyte complex. Mild cord flattening. Canal diameter 6 mm. BILATERAL C6 nerve root impingement.  C6-7:  Disc space narrowing but no stenosis or neural impingement.  C7-T1:  Unremarkable.  MR thoracic FINDINGS:  Chronic compression deformity of L1. Abnormal bone marrow edema at in T7 and T11 is observed with mild compression. While there is more involvement of the entire T7 vertebral body than there is at T11 where there is primarily superior endplate depression, there is no posterior element involvement or other osseous lesions. The degree of compression is approximately similar to that noted on prior CT chest 2 weeks ago. Slight sclerosis at T11 as seen on CT may reflect a slightly older fracture, but the sclerosis does not correspond to areas of abnormal MR enhancement to a significant degree. Benign posttraumatic compression deformities are favored but metastatic disease cannot completely be excluded. If vertebral augmentations performed, recommend bone biopsy.  No definite destructive osseous lesions are seen. There is no epidural tumor. No abnormal enhancing lesions in the spinal canal or within the cord. No paravertebral masses. Trace BILATERAL pleural effusions. No significant disc protrusion. Slight chronic retrolisthesis superior endplate L1.  IMPRESSION: Severe multilevel cervical spondylosis as described. Spinal stenosis with cord compression is observed at C3-4, C4-5, and C5-6. No abnormal cord  signal at any level.  No cervical spine metastatic disease is evident.  Enhancing compression deformities at T7 and T11. These are  favored to be posttraumatic/ osteopenic related although metastatic disease not completely excluded. Similar healed compression deformity helpful L1 appears similar. Continued surveillance is warranted.   Electronically Signed   By: Staci Righter M.D.   On: 01/14/2015 18:03    Scheduled Meds: . aspirin EC  81 mg Oral Daily  . clopidogrel  75 mg Oral Daily  . docusate sodium  100 mg Oral BID  . enoxaparin (LOVENOX) injection  40 mg Subcutaneous Q24H  . famotidine  20 mg Oral Daily  . feeding supplement (ENSURE ENLIVE)  237 mL Oral BID BM  . ferrous sulfate  325 mg Oral Q breakfast  . isosorbide mononitrate  30 mg Oral Daily  . levofloxacin (LEVAQUIN) IV  750 mg Intravenous Q24H  . methylPREDNISolone (SOLU-MEDROL) injection  60 mg Intravenous Q6H  . omega-3 acid ethyl esters  1 g Oral Daily  . simvastatin  40 mg Oral q1800    Continuous Infusions:   Assessment/Plan:  1 Acute Respiratory distress secondary to COPD exacerbation; On   IV Solumedrol. O2 and Duonebs Pt is symptomatically better CXR: Negative for Infiltrate: On Levaquin 2 Neck and back pain: MRI cervical spine; Compression deformities T7 and T11 and Evidence for cervical spondylosis, spinal stenosis with cord compression C3- C4 C4-C5 and C5-C6 3 Hyperkalemia; Improved 4 CAD- hx of stent: On Plavix, Isosorbide and statins 5 Anxiety: On Xanax  6 GERD; On Famotidine   Ambulate today   Code Status: Full      Starlit Raburn   01/15/2015, 8:20 AM  LOS: 3 days

## 2015-01-16 LAB — CBC WITH DIFFERENTIAL/PLATELET
Basophils Absolute: 0 10*3/uL (ref 0–0.1)
Basophils Relative: 0 %
EOS PCT: 0 %
Eosinophils Absolute: 0 10*3/uL (ref 0–0.7)
HCT: 26.4 % — ABNORMAL LOW (ref 40.0–52.0)
Hemoglobin: 8.3 g/dL — ABNORMAL LOW (ref 13.0–18.0)
Lymphocytes Relative: 7 %
Lymphs Abs: 0.4 10*3/uL — ABNORMAL LOW (ref 1.0–3.6)
MCH: 20.6 pg — ABNORMAL LOW (ref 26.0–34.0)
MCHC: 31.5 g/dL — AB (ref 32.0–36.0)
MCV: 65.4 fL — AB (ref 80.0–100.0)
Monocytes Absolute: 0.4 10*3/uL (ref 0.2–1.0)
Monocytes Relative: 6 %
NEUTROS ABS: 5.3 10*3/uL (ref 1.4–6.5)
Neutrophils Relative %: 87 %
Platelets: 111 10*3/uL — ABNORMAL LOW (ref 150–440)
RBC: 4.03 MIL/uL — ABNORMAL LOW (ref 4.40–5.90)
RDW: 17 % — ABNORMAL HIGH (ref 11.5–14.5)
WBC: 6 10*3/uL (ref 3.8–10.6)

## 2015-01-16 LAB — BASIC METABOLIC PANEL
Anion gap: 4 — ABNORMAL LOW (ref 5–15)
BUN: 28 mg/dL — ABNORMAL HIGH (ref 6–20)
CALCIUM: 8.6 mg/dL — AB (ref 8.9–10.3)
CHLORIDE: 108 mmol/L (ref 101–111)
CO2: 31 mmol/L (ref 22–32)
Creatinine, Ser: 0.96 mg/dL (ref 0.61–1.24)
GFR calc Af Amer: >60 mL/min (ref >60)
GLUCOSE: 135 mg/dL — AB (ref 65–99)
POTASSIUM: 4.5 mmol/L (ref 3.5–5.1)
SODIUM: 143 mmol/L (ref 135–145)

## 2015-01-16 MED ORDER — CALCIUM CARBONATE 1250 (500 CA) MG PO TABS
1.0000 | ORAL_TABLET | Freq: Every day | ORAL | Status: DC
  Administered 2015-01-17: 500 mg via ORAL
  Filled 2015-01-16 (×2): qty 1

## 2015-01-16 NOTE — Care Management (Signed)
Patient now recognizes that in his current state, is not able to return directly home.  He is agreeable to skilled nursing not only for the functional status but also for the medical stability.  His respiratory status has been on the decline.

## 2015-01-16 NOTE — Progress Notes (Signed)
PROGRESS NOTE  Haitham Dolinsky [facility] OIZ:[id number] DOB: 1940-09-01 DOA: 01/12/2015 PCP: Tracie Harrier, MD   [name]:  73  Y/o male  With hx of COPD, Chronic respiratory failure,Ca esophagus, HTN, Anemia admitted with progressive shortness of breath and a dry cough. Pt improved with IV Solumedrol and BIPAP. This am appears to be more short of breath     Objective: BP 141/78 mmHg  Pulse 70  Temp(Src) 98.1 F (36.7 C) (Oral)  Resp 22  Ht 5\' 9"  (1.753 m)  Wt 69.264 kg (152 lb 11.2 oz)  BMI 22.54 kg/m2  SpO2 96%  Intake/Output Summary (Last 24 hours) at 01/16/15 0824 Last data filed at 01/15/15 2359  Gross per 24 hour  Intake    480 ml  Output   1200 ml  Net   -720 ml   Filed Weights   01/12/15 2219 01/13/15 0104  Weight: 69.31 kg (152 lb 12.8 oz) 69.264 kg (152 lb 11.2 oz)    Exam: HEENT; [place], AT Pt is dyspneic at rest . Able to speak in full sentences  General: Pale mucosa  Cardiovascular: S1 S2  Respiratory: Bilat inspiratory and expiratory wheezes +  Abdomen: Soft. Non tender  Neuro:Non Focal   Data Reviewed: Basic Metabolic Panel:  Recent Labs Lab 01/12/15 2002 01/13/15 0528 01/14/15 0459 01/15/15 0424 01/16/15 0424  NA 140 140 139 144 143  K 5.4* 4.4 4.1 4.1 4.5  CL 106 105 104 107 108  CO2 25 25 28 31 31   GLUCOSE 88 153* 149* 137* 135*  BUN 16 17 21* 31* 28*  CREATININE 1.21 0.87 0.92 0.95 0.96  CALCIUM 8.7* 8.5* 8.8* 8.9 8.6*   Liver Function Tests:  Recent Labs Lab 01/12/15 2002 01/15/15 0424  AST 40 22  ALT 19 18  ALKPHOS 60 55  BILITOT 0.8 0.6  PROT 6.5 5.6*  ALBUMIN 3.9 3.3*   No results for input(s): LIPASE, AMYLASE in the last 168 hours. No results for input(s): AMMONIA in the last 168 hours. CBC:  Recent Labs Lab 01/12/15 2124 01/13/15 0528 01/14/15 0459 01/15/15 0424 01/16/15 0424  WBC 8.2 2.8* 6.0 5.4 6.0  NEUTROABS 7.0*  --  5.5 4.9 5.3  HGB 8.9* 8.8* 8.8* 8.3* 8.3*  HCT 28.0* 27.6* 28.0* 26.4* 26.4*   MCV 65.7* 65.8* 65.5* 65.6* 65.4*  PLT 121* 112* 119* 117* 111*   Cardiac Enzymes:    Recent Labs Lab 01/12/15 2002  TROPONINI <0.03   BNP (last 3 results)  Recent Labs  01/12/15 2021  BNP 26.0    ProBNP (last 3 results) No results for input(s): PROBNP in the last 8760 hours.  CBG: No results for input(s): GLUCAP in the last 168 hours.  Recent Results (from the past 240 hour(s))  Blood culture (routine x 2)     Status: None (Preliminary result)   Collection Time: 01/12/15  8:03 PM  Result Value Ref Range Status   Specimen Description BLOOD LEFT ARM  Final   Special Requests BOTTLES DRAWN AEROBIC AND ANAEROBIC 5CC  Final   Culture NO GROWTH 3 DAYS  Final   Report Status PENDING  Incomplete  Blood culture (routine x 2)     Status: None (Preliminary result)   Collection Time: 01/12/15  9:24 PM  Result Value Ref Range Status   Specimen Description BLOOD LEFT ARM  Final   Special Requests BOTTLES DRAWN AEROBIC AND ANAEROBIC 5CC  Final   Culture  Setup Time   Final    GRAM POSITIVE  COCCI AEROBIC BOTTLE ONLY CRITICAL RESULT CALLED TO, READ BACK BY AND VERIFIED WITH: CALLED TO LISA ROMERO @ 2219 ON 01/13/2015 CAF CONFIRMED BY [facility]    Culture   Final    COAGULASE NEGATIVE STAPHYLOCOCCUS AEROBIC BOTTLE ONLY POSSIBLE CONTAMINATION WITH SKIN FLORA    Report Status PENDING  Incomplete     Studies: No results found.  Scheduled Meds: . aspirin EC  81 mg Oral Daily  . clopidogrel  75 mg Oral Daily  . docusate sodium  100 mg Oral BID  . enoxaparin (LOVENOX) injection  40 mg Subcutaneous Q24H  . famotidine  20 mg Oral Daily  . feeding supplement (ENSURE ENLIVE)  237 mL Oral BID BM  . ferrous sulfate  325 mg Oral Q breakfast  . isosorbide mononitrate  30 mg Oral Daily  . levofloxacin (LEVAQUIN) IV  750 mg Intravenous Q24H  . methylPREDNISolone (SOLU-MEDROL) injection  40 mg Intravenous 3 times per day  . omega-3 acid ethyl esters  1 g Oral Daily  . simvastatin  40 mg  Oral q1800    Continuous Infusions:   Assessment/Plan:  1 Acute Respiratory distress secondary to COPD exacerbation; Continue  IV Solumedrol. O2  and Duonebs CXR: Negative for Infiltrate: On Levaquin 2 Hyperkalemia; Improved 3 CAD- hx of stent: On Plavix, Isosorbide and statins 4 Anxiety: On Xanax  5 GERD; On Famotidine  6 Neck and back pain: MRI- Anterolisthesis C3-C4 - Chronic and evidence for cervical spondylosis and compression deformities T 7  and T11 7 Physical Therapy   Code Status: Full      Jaleigha Deane   01/16/2015, 8:24 AM  LOS: 4 days               PROGRESS NOTE  Lester Crickenberger URK:[id number] DOB: 30-Mar-1941 DOA: 01/12/2015 PCP: Tracie Harrier, MD   Subjctive:  74  Y/o male  With hx of COPD, Chronic respiratory failure,Ca esophagus, HTN, Anemia admitted with progressive shortness of breath and a dry cough. Pt improved with IV Solumedrol and BIPAP. This am had episode of chest pain releived with Zantac. Also more short of breath with increased wheezing      Objective: BP 141/78 mmHg  Pulse 70  Temp(Src) 98.1 F (36.7 C) (Oral)  Resp 22  Ht 5\' 9"  (1.753 m)  Wt 69.264 kg (152 lb 11.2 oz)  BMI 22.54 kg/m2  SpO2 96%  Intake/Output Summary (Last 24 hours) at 01/16/15 0824 Last data filed at 01/15/15 2359  Gross per 24 hour  Intake    480 ml  Output   1200 ml  Net   -720 ml   Filed Weights   01/12/15 2219 01/13/15 0104  Weight: 69.31 kg (152 lb 12.8 oz) 69.264 kg (152 lb 11.2 oz)    Exam: HEENT; [place], AT Pt is dyspneic at rest and using accessory muscles of respiration  General: Pale mucosa  Cardiovascular: S1 S2  Respiratory: Bilat inspiratory and expiratory wheezes +  Abdomen: Soft. Non tender  Neuro:Non Focal   Data Reviewed: Basic Metabolic Panel:  Recent Labs Lab 01/12/15 2002 01/13/15 0528 01/14/15 0459 01/15/15 0424 01/16/15 0424  NA 140 140 139 144 143  K 5.4* 4.4 4.1 4.1 4.5  CL 106 105 104 107  108  CO2 25 25 28 31 31   GLUCOSE 88 153* 149* 137* 135*  BUN 16 17 21* 31* 28*  CREATININE 1.21 0.87 0.92 0.95 0.96  CALCIUM 8.7* 8.5* 8.8* 8.9 8.6*   Liver Function Tests:  Recent Labs Lab  01/12/15 2002 01/15/15 0424  AST 40 22  ALT 19 18  ALKPHOS 60 55  BILITOT 0.8 0.6  PROT 6.5 5.6*  ALBUMIN 3.9 3.3*   No results for input(s): LIPASE, AMYLASE in the last 168 hours. No results for input(s): AMMONIA in the last 168 hours. CBC:  Recent Labs Lab 01/12/15 2124 01/13/15 0528 01/14/15 0459 01/15/15 0424 01/16/15 0424  WBC 8.2 2.8* 6.0 5.4 6.0  NEUTROABS 7.0*  --  5.5 4.9 5.3  HGB 8.9* 8.8* 8.8* 8.3* 8.3*  HCT 28.0* 27.6* 28.0* 26.4* 26.4*  MCV 65.7* 65.8* 65.5* 65.6* 65.4*  PLT 121* 112* 119* 117* 111*   Cardiac Enzymes:    Recent Labs Lab 01/12/15 2002  TROPONINI <0.03   BNP (last 3 results)  Recent Labs  01/12/15 2021  BNP 26.0    ProBNP (last 3 results) No results for input(s): PROBNP in the last 8760 hours.  CBG: No results for input(s): GLUCAP in the last 168 hours.  Recent Results (from the past 240 hour(s))  Blood culture (routine x 2)     Status: None (Preliminary result)   Collection Time: 01/12/15  8:03 PM  Result Value Ref Range Status   Specimen Description BLOOD LEFT ARM  Final   Special Requests BOTTLES DRAWN AEROBIC AND ANAEROBIC 5CC  Final   Culture NO GROWTH 3 DAYS  Final   Report Status PENDING  Incomplete  Blood culture (routine x 2)     Status: None (Preliminary result)   Collection Time: 01/12/15  9:24 PM  Result Value Ref Range Status   Specimen Description BLOOD LEFT ARM  Final   Special Requests BOTTLES DRAWN AEROBIC AND ANAEROBIC 5CC  Final   Culture  Setup Time   Final    GRAM POSITIVE COCCI AEROBIC BOTTLE ONLY CRITICAL RESULT CALLED TO, READ BACK BY AND VERIFIED WITH: CALLED TO LISA ROMERO @ 2219 ON 01/13/2015 CAF CONFIRMED BY [facility]    Culture   Final    COAGULASE NEGATIVE STAPHYLOCOCCUS AEROBIC BOTTLE ONLY POSSIBLE  CONTAMINATION WITH SKIN FLORA    Report Status PENDING  Incomplete     Studies: No results found.  Scheduled Meds: . aspirin EC  81 mg Oral Daily  . clopidogrel  75 mg Oral Daily  . docusate sodium  100 mg Oral BID  . enoxaparin (LOVENOX) injection  40 mg Subcutaneous Q24H  . famotidine  20 mg Oral Daily  . feeding supplement (ENSURE ENLIVE)  237 mL Oral BID BM  . ferrous sulfate  325 mg Oral Q breakfast  . isosorbide mononitrate  30 mg Oral Daily  . levofloxacin (LEVAQUIN) IV  750 mg Intravenous Q24H  . methylPREDNISolone (SOLU-MEDROL) injection  40 mg Intravenous 3 times per day  . omega-3 acid ethyl esters  1 g Oral Daily  . simvastatin  40 mg Oral q1800    Continuous Infusions:   Assessment/Plan:  1 Acute Respiratory distress secondary to COPD exacerbation; On   IV Solumedrol. O2 and Duonebs Pt is symptomatically better CXR: Negative for Infiltrate: On Levaquin 2 Neck and back pain: MRI cervical spine; Compression deformities T7 and T11 and Evidence for cervical spondylosis, spinal stenosis with cord compression C3- C4 C4-C5 and C5-C6 3 Hyperkalemia; Improved 4 CAD- hx of stent: On Plavix, Isosorbide and statins 5 Anxiety: On Xanax  6 GERD; On Famotidine   Ambulate today   Code Status: Full      Kynadie Yaun   01/16/2015, 8:24 AM  LOS: 4 days

## 2015-01-16 NOTE — Evaluation (Signed)
Physical Therapy Evaluation Patient Details Name: Charles Rose MRN: [id number] DOB: 1940/10/02 Today's Date: 01/16/2015   History of Present Illness  74 yo male with onset of L1 compression fracture, T7 and T11 changes, and C3-4, C4-5 and C5-6 cord compression and retrolisthesis without fracture.  PHx:  esophageal CA,   Clinical Impression  Pt was seen for mobility training and is unsafe to walk alone due to fatigue and issues with cord related weakness.  He is in need of inpt therapy and was being discussed with him by case manager during PT visit.    Follow Up Recommendations SNF    Equipment Recommendations  Rolling walker with 5" wheels    Recommendations for Other Services       Precautions / Restrictions Precautions Precautions: Fall Restrictions Weight Bearing Restrictions: No      Mobility  Bed Mobility Overal bed mobility: Needs Assistance Bed Mobility: Supine to Sit     Supine to sit: Min assist        Transfers Overall transfer level: Needs assistance Equipment used: Rolling walker (2 wheeled);1 person hand held assist Transfers: Sit to/from [facility] Sit to Stand: Min assist Stand pivot transfers: Min assist       General transfer comment: reminders for hand placement  Ambulation/Gait Ambulation/Gait assistance: Min assist Ambulation Distance (Feet): 100 Feet Assistive device: Rolling walker (2 wheeled);1 person hand held assist Gait Pattern/deviations: Step-through pattern;Wide base of support;Trunk flexed Gait velocity: reduced Gait velocity interpretation: Below normal speed for age/gender General Gait Details: Pt is walking with some difficulty with fatigue, SOB mildlly but with 3L L2 on cannula.  Sats were stable with gait, 99% to 93% with walk  Stairs            Wheelchair Mobility    Modified Rankin (Stroke Patients Only)       Balance Overall balance assessment: Needs assistance Sitting-balance  support: Feet supported Sitting balance-Leahy Scale: Fair   Postural control: Posterior lean Standing balance support: Bilateral upper extremity supported Standing balance-Leahy Scale: Fair                               Pertinent Vitals/Pain Pain Assessment: No/denies pain    Home Living Family/patient expects to be discharged to:: Private residence Living Arrangements: Alone Available Help at Discharge: Family;Friend(s);Available PRN/intermittently Type of Home: House Home Access: Stairs to enter   Entrance Stairs-Number of Steps: 3 Home Layout: One level Home Equipment: Walker - 4 wheels;Cane - single point      Prior Function Level of Independence: Independent               Hand Dominance        Extremity/Trunk Assessment   Upper Extremity Assessment: Overall WFL for tasks assessed           Lower Extremity Assessment: Generalized weakness      Cervical / Trunk Assessment: Normal  Communication   Communication: No difficulties  Cognition Arousal/Alertness: Awake/alert Behavior During Therapy: WFL for tasks assessed/performed Overall Cognitive Status: Within Functional Limits for tasks assessed                      General Comments General comments (skin integrity, edema, etc.): walked to outer hallway with some decreased energy by the end of the hall and walke slowly back, sats were stable but pt tired.  Has a certain amount of discomfort in bed with spine,  not with gait,    Exercises        Assessment/Plan    PT Assessment Patient needs continued PT services  PT Diagnosis Difficulty walking;Generalized weakness   PT Problem List Decreased strength;Decreased range of motion;Decreased activity tolerance;Decreased balance;Decreased mobility;Decreased coordination;Decreased knowledge of use of DME;Decreased safety awareness;Cardiopulmonary status limiting activity;Pain  PT Treatment Interventions DME instruction;Gait  training;Stair training;Functional mobility training;Therapeutic activities;Therapeutic exercise;Balance training;Neuromuscular re-education;Patient/family education   PT Goals (Current goals can be found in the Care Plan section) Acute Rehab PT Goals Patient Stated Goal: to get his back to not hurt PT Goal Formulation: With patient Time For Goal Achievement: 01/30/15 Potential to Achieve Goals: Good    Frequency Min 2X/week   Barriers to discharge Decreased caregiver support home alone    Co-evaluation               End of Session Equipment Utilized During Treatment: Other (comment);Oxygen;Gait belt (prompts for upright posture) Activity Tolerance: Patient tolerated treatment well Patient left: in chair;with call bell/phone within reach;with chair alarm set Nurse Communication: Mobility status         Time: 0737-1062 PT Time Calculation (min) (ACUTE ONLY): 34 min   Charges:   PT Evaluation $Initial PT Evaluation Tier I: 1 Procedure PT Treatments $Gait Training: 8-22 mins   PT G Codes:        Ramond Dial 02/05/15, 1:53 PM   Mee Hives, PT MS Acute Rehab Dept. Number: [phone] [id number] and [place] [phone]

## 2015-01-16 NOTE — Care Management Important Message (Signed)
Important Message  Patient Details  Name: Charles Rose MRN: [id number] Date of Birth: 09/04/1940   Medicare Important Message Given:  Yes-third notification given    Juliann Pulse A Allmond 01/16/2015, 10:05 AM

## 2015-01-16 NOTE — Progress Notes (Signed)
ANTIBIOTIC CONSULT NOTE - Follow Up  Pharmacy Consult for [facility] Indication: Acute exacerbation of COPD  No Known Allergies  Patient Measurements: Height: 5\' 9"  (175.3 cm) Weight: 152 lb 11.2 oz (69.264 kg) IBW/kg (Calculated) : 70.7 Adjusted Body Weight:   Vital Signs:   Intake/Output from previous day: 07/27 0701 - 07/28 0700 In: 720 [P.O.:720] Out: 1200 [Urine:1200] Intake/Output from this shift:    Labs:  Recent Labs  01/14/15 0459 01/15/15 0424 01/16/15 0424  WBC 6.0 5.4 6.0  HGB 8.8* 8.3* 8.3*  PLT 119* 117* 111*  CREATININE 0.92 0.95 0.96   Estimated Creatinine Clearance: 68.2 mL/min (by C-G formula based on Cr of 0.96).   Microbiology: Recent Results (from the past 720 hour(s))  Blood culture (routine x 2)     Status: None (Preliminary result)   Collection Time: 01/12/15  8:03 PM  Result Value Ref Range Status   Specimen Description BLOOD LEFT ARM  Final   Special Requests BOTTLES DRAWN AEROBIC AND ANAEROBIC 5CC  Final   Culture NO GROWTH 3 DAYS  Final   Report Status PENDING  Incomplete  Blood culture (routine x 2)     Status: None (Preliminary result)   Collection Time: 01/12/15  9:24 PM  Result Value Ref Range Status   Specimen Description BLOOD LEFT ARM  Final   Special Requests BOTTLES DRAWN AEROBIC AND ANAEROBIC 5CC  Final   Culture  Setup Time   Final    GRAM POSITIVE COCCI AEROBIC BOTTLE ONLY CRITICAL RESULT CALLED TO, READ BACK BY AND VERIFIED WITH: CALLED TO LISA ROMERO @ 2219 ON 01/13/2015 CAF CONFIRMED BY [facility]    Culture   Final    COAGULASE NEGATIVE STAPHYLOCOCCUS AEROBIC BOTTLE ONLY POSSIBLE CONTAMINATION WITH SKIN FLORA    Report Status PENDING  Incomplete    Medical History: Past Medical History  Diagnosis Date  . Coronary artery disease   . COPD (chronic obstructive pulmonary disease)   . Hyperlipidemia   . Thrombocytopenia   . Hypertension   . Anemia   . Anxiety   . Asthma   . Cancer     esophagus     Medications:  Scheduled:  . aspirin EC  81 mg Oral Daily  . clopidogrel  75 mg Oral Daily  . docusate sodium  100 mg Oral BID  . enoxaparin (LOVENOX) injection  40 mg Subcutaneous Q24H  . famotidine  20 mg Oral Daily  . feeding supplement (ENSURE ENLIVE)  237 mL Oral BID BM  . ferrous sulfate  325 mg Oral Q breakfast  . isosorbide mononitrate  30 mg Oral Daily  . levofloxacin (LEVAQUIN) IV  750 mg Intravenous Q24H  . methylPREDNISolone (SOLU-MEDROL) injection  40 mg Intravenous 3 times per day  . omega-3 acid ethyl esters  1 g Oral Daily  . simvastatin  40 mg Oral q1800   Assessment: 74 yo male with acute exacerbation of COPD to start Levaquin per MD.  Plan:  Follow up culture results  Will continue Levaquin 750 mg IV q24h.   Murrell Converse, PharmD Clinical Pharmacist 01/16/2015

## 2015-01-16 NOTE — Plan of Care (Signed)
Problem: Acute Rehab PT Goals(only PT should resolve) Goal: Pt Will Go Supine/Side To Sit With back precautions observed

## 2015-01-17 LAB — BASIC METABOLIC PANEL
Anion gap: 4 — ABNORMAL LOW (ref 5–15)
BUN: 24 mg/dL — ABNORMAL HIGH (ref 6–20)
CALCIUM: 8.7 mg/dL — AB (ref 8.9–10.3)
CO2: 31 mmol/L (ref 22–32)
CREATININE: 0.77 mg/dL (ref 0.61–1.24)
Chloride: 107 mmol/L (ref 101–111)
GFR calc Af Amer: >60 mL/min (ref >60)
GFR calc non Af Amer: >60 mL/min (ref >60)
Glucose, Bld: 126 mg/dL — ABNORMAL HIGH (ref 65–99)
Potassium: 4.3 mmol/L (ref 3.5–5.1)
Sodium: 142 mmol/L (ref 135–145)

## 2015-01-17 LAB — CULTURE, BLOOD (ROUTINE X 2): Culture: NO GROWTH

## 2015-01-17 LAB — CBC WITH DIFFERENTIAL/PLATELET
BASOS ABS: 0 10*3/uL (ref 0–0.1)
Basophils Relative: 0 %
Eosinophils Absolute: 0 10*3/uL (ref 0–0.7)
Eosinophils Relative: 0 %
HCT: 26.5 % — ABNORMAL LOW (ref 40.0–52.0)
Hemoglobin: 8.3 g/dL — ABNORMAL LOW (ref 13.0–18.0)
LYMPHS PCT: 8 %
Lymphs Abs: 0.4 10*3/uL — ABNORMAL LOW (ref 1.0–3.6)
MCH: 20.5 pg — ABNORMAL LOW (ref 26.0–34.0)
MCHC: 31.4 g/dL — ABNORMAL LOW (ref 32.0–36.0)
MCV: 65.4 fL — ABNORMAL LOW (ref 80.0–100.0)
MONO ABS: 0.3 10*3/uL (ref 0.2–1.0)
Monocytes Relative: 6 %
NEUTROS PCT: 86 %
Neutro Abs: 4.4 10*3/uL (ref 1.4–6.5)
Platelets: 104 10*3/uL — ABNORMAL LOW (ref 150–440)
RBC: 4.05 MIL/uL — AB (ref 4.40–5.90)
RDW: 16.9 % — AB (ref 11.5–14.5)
WBC: 5.1 10*3/uL (ref 3.8–10.6)

## 2015-01-17 MED ORDER — POLYETHYLENE GLYCOL 3350 17 G PO PACK: 17.0000 g | PACK | Freq: Every day | ORAL | Status: DC | PRN

## 2015-01-17 MED ORDER — LEVOFLOXACIN 500 MG PO TABS: 500.0000 mg | ORAL_TABLET | Freq: Every day | ORAL | Status: DC

## 2015-01-17 MED ORDER — ZOLPIDEM TARTRATE 5 MG PO TABS: 5.0000 mg | ORAL_TABLET | Freq: Every evening | ORAL | Status: DC | PRN

## 2015-01-17 MED ORDER — OXYCODONE HCL 5 MG PO TABS: 5.0000 mg | ORAL_TABLET | Freq: Three times a day (TID) | ORAL | Status: DC | PRN

## 2015-01-17 MED ORDER — ALUM & MAG HYDROXIDE-SIMETH 200-200-20 MG/5ML PO SUSP: 30.0000 mL | Freq: Four times a day (QID) | ORAL | Status: DC | PRN

## 2015-01-17 MED ORDER — LEVOFLOXACIN 750 MG PO TABS: 750.0000 mg | ORAL_TABLET | Freq: Every day | ORAL | Status: DC

## 2015-01-17 MED ORDER — METHYLPREDNISOLONE SODIUM SUCC 40 MG IJ SOLR
20.0000 mg | Freq: Three times a day (TID) | INTRAMUSCULAR | Status: DC
  Administered 2015-01-17 (×2): 20 mg via INTRAVENOUS
  Filled 2015-01-17 (×2): qty 1

## 2015-01-17 MED ORDER — OXYCODONE HCL 5 MG PO TABS: 5.0000 mg | ORAL_TABLET | ORAL | Status: DC | PRN

## 2015-01-17 MED ORDER — PREDNISONE 10 MG PO TABS: ORAL_TABLET | ORAL | Status: DC

## 2015-01-17 MED ORDER — ENSURE ENLIVE PO LIQD: 237.0000 mL | Freq: Two times a day (BID) | ORAL | Status: AC

## 2015-01-17 MED ORDER — CALCIUM CARBONATE 1250 (500 CA) MG PO TABS: 1.0000 | ORAL_TABLET | Freq: Every day | ORAL | Status: DC

## 2015-01-17 NOTE — Care Management (Signed)
For discharge to skilled nursing today

## 2015-01-17 NOTE — Progress Notes (Signed)
Physical Therapy Treatment Patient Details Name: Charles Rose MRN: [id number] DOB: 1941/04/24 Today's Date: 01/17/2015    History of Present Illness 74 yo male with onset of L1 compression fracture, T7 and T11 changes, and C3-4, C4-5 and C5-6 cord compression and retrolisthesis without fracture.  PHx:  esophageal CA,     PT Comments    Pt is having no pain to walk, with better control of standing and O2 sats with 2L O2 were 97& pregait and 95% post with no SOB with the effort.  Follow Up Recommendations  SNF     Equipment Recommendations  Rolling walker with 5" wheels    Recommendations for Other Services       Precautions / Restrictions Precautions Precautions: Fall Restrictions Weight Bearing Restrictions: No    Mobility  Bed Mobility Overal bed mobility: Needs Assistance Bed Mobility: Sidelying to Sit;Supine to Sit   Sidelying to sit: Min guard;Min assist Supine to sit: Min guard;Min assist     General bed mobility comments: using bedrailsfor support  Transfers Overall transfer level: Needs assistance Equipment used: Rolling walker (2 wheeled) Transfers: Sit to/from [facility] Sit to Stand: Min guard;Min assist Stand pivot transfers: Min guard;Min assist          Ambulation/Gait Ambulation/Gait assistance: Min guard;Min assist Ambulation Distance (Feet): 300 Feet Assistive device: Rolling walker (2 wheeled);1 person hand held assist Gait Pattern/deviations: Step-to pattern;Step-through pattern;Wide base of support;Shuffle Gait velocity: reduced Gait velocity interpretation: Below normal speed for age/gender General Gait Details: No obvious SOB today and progressing at comfortable pace   Stairs            Wheelchair Mobility    Modified Rankin (Stroke Patients Only)       Balance Overall balance assessment: Needs assistance   Sitting balance-Leahy Scale: Good       Standing balance-Leahy Scale: Fair                       Cognition Arousal/Alertness: Awake/alert Behavior During Therapy: WFL for tasks assessed/performed Overall Cognitive Status: Within Functional Limits for tasks assessed                      Exercises General Exercises - Lower Extremity Ankle Circles/Pumps: AROM;Both;5 reps Quad Sets: AROM;10 reps;Both Hip ABduction/ADduction: AROM;Both;10 reps    General Comments General comments (skin integrity, edema, etc.): Gaoit continues to be comfortabel with spine, no issues of pain with bed mob      Pertinent Vitals/Pain Pain Assessment: No/denies pain    Home Living                      Prior Function            PT Goals (current goals can now be found in the care plan section) Acute Rehab PT Goals Patient Stated Goal: to get home Progress towards PT goals: Progressing toward goals    Frequency  Min 2X/week    PT Plan Current plan remains appropriate    Co-evaluation             End of Session Equipment Utilized During Treatment: Gait belt;Oxygen Activity Tolerance: Patient tolerated treatment well Patient left: in chair;with call bell/phone within reach;with chair alarm set     Time: 4403-4742 PT Time Calculation (min) (ACUTE ONLY): 29 min  Charges:  $Gait Training: 8-22 mins $Therapeutic Exercise: 8-22 mins  G Codes:      Ramond Dial 01/17/2015, 1:27 PM   Mee Hives, PT MS Acute Rehab Dept. Number: [facility] [id number] and [place] [phone]

## 2015-01-17 NOTE — Discharge Summary (Signed)
Physician Discharge Summary  Charles Rose UJW:[id number] DOB: [birth year] DOA: 01/12/2015  PCP: Tracie Harrier, MD  Admit date: 01/12/2015 Discharge date: 01/17/2015  Time spent: 35 minutes  Recommendations for Outpatient Follow-up: 1. Call office to make appt in 2-3  days  Discharge Diagnoses:   1 Acute Respiratory distress secondary to COPD exacerbation 2  Anxiety 3 Chronic  Mid back and neck pain. 4 Hx of Ca esophagus  5 Anemia  6 Weakness    Discharge Condition: Stable  Diet recommendation:   Filed Weights   01/12/15 2219 01/13/15 0104  Weight: 69.31 kg (152 lb 12.8 oz) 69.264 kg (152 lb 11.2 oz)    History of present illness:  Charles Rose is a 74 year old male with a known history of COPD,Chronic hypoxemia presented to the ED complaining of severe shortness of breath for about 2 weeks associated with a dry cough. Patient received  Broncho dilator therapy as well as IV Solu-Medrol but continued to be symptomatic and subsequently placed on BiPAP that was later weaned.  Hospital Course:  He was admitted [facility] and received intravenous steroids, and also anabiotics and SVN's She did complain of neck and back pain and an MRI of his neck showed evidence of multilevel cervical spondylosis with spinal stenosis and cord compression observed at C3-C4 and C4-C5 and C5-C6.Marland Kitchen Patient also had an enhancing compression deformities at T7 and T11 this was felt to be posttraumatic/osteopenic in nature-although metastatic disease was not completely excluded. Chest x-ray showed evidence of right middle lobe and lower lobe scarring. No active infiltrate was seen Patient had episodes of shortness of breath associated anxiety. However he made gradual progress He was seen by Physical therapy and advised Skilled nursing. Patient was advised to complete course of antibiotics and tapering schedule of prednisone and continued on O2  Patient was discharged stable  condition to skilled nursing facility he'll follow me Dr. Ginette Pitman in 2-3 weeks.     Discharge Exam: Filed Vitals:   01/17/15 1125  BP: 144/69  Pulse: 84  Temp: 98.4 F (36.9 C)  Resp: 19   HEENT: [place], AT General: Anxious Cardiovascular: S1 S2 Respiratory: Decreased air entry Bilat   Discharge Instructions   Discharge Instructions    Diet - low sodium heart healthy    Complete by:  As directed      Discharge instructions    Complete by:  As directed   D/c to Skilled Nursing Today Call office to make appt in 1-2 weeks          Current Discharge Medication List    START taking these medications   Details  alum & mag hydroxide-simeth (MAALOX/MYLANTA) 200-200-20 MG/5ML suspension Take 30 mLs by mouth every 6 (six) hours as needed for indigestion or heartburn (dyspepsia). Qty: 355 mL, Refills: 0    calcium carbonate (OS-CAL - DOSED IN MG OF ELEMENTAL CALCIUM) 1250 (500 CA) MG tablet Take 1 tablet (500 mg of elemental calcium total) by mouth daily with breakfast. Qty: 60 tablet, Refills: 5    feeding supplement, ENSURE ENLIVE, (ENSURE ENLIVE) LIQD Take 237 mLs by mouth 2 (two) times daily between meals. Qty: 237 mL, Refills: 12    oxyCODONE (OXY IR/ROXICODONE) 5 MG immediate release tablet Take 1 tablet (5 mg total) by mouth 3 (three) times daily as needed for moderate pain. Qty: 30 tablet, Refills: 0    polyethylene glycol (MIRALAX / GLYCOLAX) packet Take 17 g by mouth daily as needed for mild constipation. Qty: 14 each,  Refills: 0    zolpidem (AMBIEN) 5 MG tablet Take 1 tablet (5 mg total) by mouth at bedtime as needed for sleep. Qty: 30 tablet, Refills: 0      CONTINUE these medications which have CHANGED   Details  levofloxacin (LEVAQUIN) 500 MG tablet Take 1 tablet (500 mg total) by mouth daily. Qty: 10 tablet, Refills: 0    predniSONE (DELTASONE) 10 MG tablet Prednisone 10 mg tabs  Take 4 tabs po once daily for 3 days Take 3 tabs po once daily for 3  days Take 2 tabs po once daily for 3 days Take 1 tab po once daily for 3 days And then stop  Total: 30 tablets Qty: 30 tablet, Refills: 0      CONTINUE these medications which have NOT CHANGED   Details  albuterol (PROVENTIL HFA;VENTOLIN HFA) 108 (90 BASE) MCG/ACT inhaler Inhale 2 puffs into the lungs every 6 (six) hours as needed for wheezing or shortness of breath.    ALPRAZolam (XANAX) 0.25 MG tablet Take 0.25 mg by mouth 2 (two) times daily as needed for anxiety.    aspirin 81 MG tablet Take 81 mg by mouth daily.    clopidogrel (PLAVIX) 75 MG tablet Take 75 mg by mouth daily.    ferrous sulfate 325 (65 FE) MG tablet Take 325 mg by mouth daily with breakfast.    ipratropium-albuterol (DUONEB) 0.5-2.5 (3) MG/3ML SOLN Take 3 mLs by nebulization 3 (three) times daily.    isosorbide mononitrate (IMDUR) 30 MG 24 hr tablet Take 30 mg by mouth daily.    nitroGLYCERIN (NITROSTAT) 0.4 MG SL tablet Place 0.4 mg under the tongue every 5 (five) minutes as needed for chest pain.    omega-3 acid ethyl esters (LOVAZA) 1 G capsule Take 1 g by mouth daily.    ranitidine (ZANTAC) 150 MG tablet Take 150 mg by mouth 2 (two) times daily.    simvastatin (ZOCOR) 40 MG tablet Take 40 mg by mouth daily at 6 PM.       No Known Allergies    The results of significant diagnostics from this hospitalization (including imaging, microbiology, ancillary and laboratory) are listed below for reference.    Significant Diagnostic Studies: Dg Chest 1 View  01/12/2015   CLINICAL DATA:  Dyspnea, shortness of breath for 3 weeks  EXAM: CHEST  1 VIEW  COMPARISON:  08/15/2014  FINDINGS: There is mild right middle lobe and lower lobe scarring. There is no focal parenchymal opacity. There is no pleural effusion or pneumothorax. The heart and mediastinal contours are unremarkable.  The osseous structures are unremarkable.  IMPRESSION: No active disease.   Electronically Signed   By: Kathreen Devoid   On: 01/12/2015  20:24   Ct Angio Chest Pe W/cm &/or Wo Cm  01/01/2015   CLINICAL DATA:  Chest pain since 3 a.m. this morning. Continuous pain all day. Pain increases with deep breaths. Current treatment for upper respiratory infection. 20 lb unintentional weight loss over the last month. History of esophageal cancer with surgery and chemotherapy.  EXAM: CT ANGIOGRAPHY CHEST WITH CONTRAST  TECHNIQUE: Multidetector CT imaging of the chest was performed using the standard protocol during bolus administration of intravenous contrast. Multiplanar CT image reconstructions and MIPs were obtained to evaluate the vascular anatomy.  CONTRAST:  173mL OMNIPAQUE IOHEXOL 350 MG/ML SOLN  COMPARISON:  08/02/2013  FINDINGS: Technically adequate study with good opacification of the central and segmental pulmonary arteries. No focal filling defects demonstrated. No evidence of  significant pulmonary embolus.  Normal heart size. Coronary artery calcifications. Normal caliber thoracic aorta. No evidence of aortic dissection. Great vessel origins are patent. Esophagus is mostly decompressed. There is suggestion of esophageal wall thickening which may be related to patient's history of esophageal carcinoma. Reflux disease or esophagitis could also have this appearance. No significant lymphadenopathy in the chest. Visualized mediastinal lymph nodes are not pathologically enlarged.  Diffuse emphysematous changes in the lungs with scattered fibrosis. Mild bronchiectasis and bronchial wall thickening in the lung bases and hilar regions suggesting chronic bronchitis. Right lower lung nodule demonstrated previously is not distinctly visualized but there is more atelectasis or scarring in this area than on the previous study. No pleural effusions. No pneumothorax.  Included portions of the upper abdominal organs demonstrate day 16 mm right adrenal gland nodule containing focal calcification. This is nonspecific but is not change since prior study.  Degenerative changes in the spine. Old compression of L1 similar prior study. New compression demonstrated at T7 and T11. Sclerosis in the T11 vertebra suggestive this could represent pathologic fracture due to metastasis. Compression at L1 demonstrates mild progression with mild developing retrolisthesis of fracture fragment. There is approximately 20 percent loss of height at T7, 30% loss of height at T11, and 70% loss of height at L1.  Review of the MIP images confirms the above findings.  IMPRESSION: No evidence of significant pulmonary embolus. Mild diffuse wall thickening of the esophagus may be related to known esophageal carcinoma or reflux disease. No esophageal obstruction.  Diffuse emphysema, fibrosis, and chronic bronchitic changes. Stable compression fracture of L1 with new compression fractures of T11 and T7. Bone metastasis should be excluded.   Electronically Signed   By: Lucienne Capers M.D.   On: 01/01/2015 23:55   Mr Cervical Spine W Wo Contrast  01/14/2015   CLINICAL DATA:  Neck pain and upper back pain. Coughing. History of esophageal cancer.  EXAM: MRI CERVICAL SPINE WITHOUT AND WITH CONTRAST; MRI THORACIC SPINE WITHOUT AND WITH CONTRAST  TECHNIQUE: Multiplanar and multiecho pulse sequences of the cervical spine, to include the craniocervical junction and cervicothoracic junction, were obtained according to standard protocol without and with intravenous contrast.; Multiplanar and multiecho pulse sequences of the thoracic spine were obtained without and with intravenous contrast.  CONTRAST:  58mL MULTIHANCE GADOBENATE DIMEGLUMINE 529 MG/ML IV SOLN  COMPARISON:  CT chest 01/01/2015.  CTA neck 06/26/2014.  FINDINGS: MR cervical FINDINGS:  Alignment: Significant anterolisthesis C3-4 of 4 mm is chronic, unchanged from prior prior CTA. Slight retrolisthesis C5-6 also chronic.  Vertebrae: No worrisome osseous lesions.  Cord: Severe cord compression at C3-4 is multifactorial related to slip and  disc material described below. Moderate cord compression at C4-5 and C5-6 is multifactorial as well. No abnormal cord signal at any level.  Posterior Fossa: Unremarkable.  Vertebral Arteries: Patent and equal.  Paraspinal tissues: Unremarkable.  Disc levels:  The individual disc spaces were examined as follows:  C2-3: Central protrusion. Slight effacement anterior subarachnoid space but no definite impingement.  C3-4: Moderate cord compression due to slip and central extrusion. Canal diameter 5 mm. BILATERAL C4 nerve root impingement.  C4-5: Central protrusion. Moderate cord flattening. LEFT greater than RIGHT C5 nerve root impingement.  C5-6: 2 mm retrolisthesis. Central disc osteophyte complex. Mild cord flattening. Canal diameter 6 mm. BILATERAL C6 nerve root impingement.  C6-7:  Disc space narrowing but no stenosis or neural impingement.  C7-T1:  Unremarkable.  MR thoracic FINDINGS:  Chronic compression deformity of L1.  Abnormal bone marrow edema at in T7 and T11 is observed with mild compression. While there is more involvement of the entire T7 vertebral body than there is at T11 where there is primarily superior endplate depression, there is no posterior element involvement or other osseous lesions. The degree of compression is approximately similar to that noted on prior CT chest 2 weeks ago. Slight sclerosis at T11 as seen on CT may reflect a slightly older fracture, but the sclerosis does not correspond to areas of abnormal MR enhancement to a significant degree. Benign posttraumatic compression deformities are favored but metastatic disease cannot completely be excluded. If vertebral augmentations performed, recommend bone biopsy.  No definite destructive osseous lesions are seen. There is no epidural tumor. No abnormal enhancing lesions in the spinal canal or within the cord. No paravertebral masses. Trace BILATERAL pleural effusions. No significant disc protrusion. Slight chronic retrolisthesis superior  endplate L1.  IMPRESSION: Severe multilevel cervical spondylosis as described. Spinal stenosis with cord compression is observed at C3-4, C4-5, and C5-6. No abnormal cord signal at any level.  No cervical spine metastatic disease is evident.  Enhancing compression deformities at T7 and T11. These are favored to be posttraumatic/ osteopenic related although metastatic disease not completely excluded. Similar healed compression deformity helpful L1 appears similar. Continued surveillance is warranted.   Electronically Signed   By: Staci Righter M.D.   On: 01/14/2015 18:03   Mr Thoracic Spine W Wo Contrast  01/14/2015   CLINICAL DATA:  Neck pain and upper back pain. Coughing. History of esophageal cancer.  EXAM: MRI CERVICAL SPINE WITHOUT AND WITH CONTRAST; MRI THORACIC SPINE WITHOUT AND WITH CONTRAST  TECHNIQUE: Multiplanar and multiecho pulse sequences of the cervical spine, to include the craniocervical junction and cervicothoracic junction, were obtained according to standard protocol without and with intravenous contrast.; Multiplanar and multiecho pulse sequences of the thoracic spine were obtained without and with intravenous contrast.  CONTRAST:  55mL MULTIHANCE GADOBENATE DIMEGLUMINE 529 MG/ML IV SOLN  COMPARISON:  CT chest 01/01/2015.  CTA neck 06/26/2014.  FINDINGS: MR cervical FINDINGS:  Alignment: Significant anterolisthesis C3-4 of 4 mm is chronic, unchanged from prior prior CTA. Slight retrolisthesis C5-6 also chronic.  Vertebrae: No worrisome osseous lesions.  Cord: Severe cord compression at C3-4 is multifactorial related to slip and disc material described below. Moderate cord compression at C4-5 and C5-6 is multifactorial as well. No abnormal cord signal at any level.  Posterior Fossa: Unremarkable.  Vertebral Arteries: Patent and equal.  Paraspinal tissues: Unremarkable.  Disc levels:  The individual disc spaces were examined as follows:  C2-3: Central protrusion. Slight effacement anterior  subarachnoid space but no definite impingement.  C3-4: Moderate cord compression due to slip and central extrusion. Canal diameter 5 mm. BILATERAL C4 nerve root impingement.  C4-5: Central protrusion. Moderate cord flattening. LEFT greater than RIGHT C5 nerve root impingement.  C5-6: 2 mm retrolisthesis. Central disc osteophyte complex. Mild cord flattening. Canal diameter 6 mm. BILATERAL C6 nerve root impingement.  C6-7:  Disc space narrowing but no stenosis or neural impingement.  C7-T1:  Unremarkable.  MR thoracic FINDINGS:  Chronic compression deformity of L1. Abnormal bone marrow edema at in T7 and T11 is observed with mild compression. While there is more involvement of the entire T7 vertebral body than there is at T11 where there is primarily superior endplate depression, there is no posterior element involvement or other osseous lesions. The degree of compression is approximately similar to that noted on prior CT chest  2 weeks ago. Slight sclerosis at T11 as seen on CT may reflect a slightly older fracture, but the sclerosis does not correspond to areas of abnormal MR enhancement to a significant degree. Benign posttraumatic compression deformities are favored but metastatic disease cannot completely be excluded. If vertebral augmentations performed, recommend bone biopsy.  No definite destructive osseous lesions are seen. There is no epidural tumor. No abnormal enhancing lesions in the spinal canal or within the cord. No paravertebral masses. Trace BILATERAL pleural effusions. No significant disc protrusion. Slight chronic retrolisthesis superior endplate L1.  IMPRESSION: Severe multilevel cervical spondylosis as described. Spinal stenosis with cord compression is observed at C3-4, C4-5, and C5-6. No abnormal cord signal at any level.  No cervical spine metastatic disease is evident.  Enhancing compression deformities at T7 and T11. These are favored to be posttraumatic/ osteopenic related although  metastatic disease not completely excluded. Similar healed compression deformity helpful L1 appears similar. Continued surveillance is warranted.   Electronically Signed   By: Staci Righter M.D.   On: 01/14/2015 18:03    Microbiology: Recent Results (from the past 240 hour(s))  Blood culture (routine x 2)     Status: None   Collection Time: 01/12/15  8:03 PM  Result Value Ref Range Status   Specimen Description BLOOD LEFT ARM  Final   Special Requests BOTTLES DRAWN AEROBIC AND ANAEROBIC 5CC  Final   Culture NO GROWTH 5 DAYS  Final   Report Status 01/17/2015 FINAL  Final  Blood culture (routine x 2)     Status: None   Collection Time: 01/12/15  9:24 PM  Result Value Ref Range Status   Specimen Description BLOOD LEFT ARM  Final   Special Requests BOTTLES DRAWN AEROBIC AND ANAEROBIC 5CC  Final   Culture  Setup Time   Final    GRAM POSITIVE COCCI AEROBIC BOTTLE ONLY CRITICAL RESULT CALLED TO, READ BACK BY AND VERIFIED WITH: CALLED TO LISA ROMERO @ 2219 ON 01/13/2015 [facility] CONFIRMED BY [facility]    Culture   Final    COAGULASE NEGATIVE STAPHYLOCOCCUS AEROBIC BOTTLE ONLY POSSIBLE CONTAMINATION WITH SKIN FLORA    Report Status 01/17/2015 FINAL  Final     Labs: Basic Metabolic Panel:  Recent Labs Lab 01/13/15 0528 01/14/15 0459 01/15/15 0424 01/16/15 0424 01/17/15 0457  NA 140 139 144 143 142  K 4.4 4.1 4.1 4.5 4.3  CL 105 104 107 108 107  CO2 25 28 31 31 31   GLUCOSE 153* 149* 137* 135* 126*  BUN 17 21* 31* 28* 24*  CREATININE 0.87 0.92 0.95 0.96 0.77  CALCIUM 8.5* 8.8* 8.9 8.6* 8.7*   Liver Function Tests:  Recent Labs Lab 01/12/15 2002 01/15/15 0424  AST 40 22  ALT 19 18  ALKPHOS 60 55  BILITOT 0.8 0.6  PROT 6.5 5.6*  ALBUMIN 3.9 3.3*   No results for input(s): LIPASE, AMYLASE in the last 168 hours. No results for input(s): AMMONIA in the last 168 hours. CBC:  Recent Labs Lab 01/12/15 2124 01/13/15 0528 01/14/15 0459 01/15/15 0424 01/16/15 0424 01/17/15 0457   WBC 8.2 2.8* 6.0 5.4 6.0 5.1  NEUTROABS 7.0*  --  5.5 4.9 5.3 4.4  HGB 8.9* 8.8* 8.8* 8.3* 8.3* 8.3*  HCT 28.0* 27.6* 28.0* 26.4* 26.4* 26.5*  MCV 65.7* 65.8* 65.5* 65.6* 65.4* 65.4*  PLT 121* 112* 119* 117* 111* 104*   Cardiac Enzymes:  Recent Labs Lab 01/12/15 2002  TROPONINI <0.03   BNP: BNP (last 3 results)  Recent Labs  01/12/15 2021  BNP 26.0    ProBNP (last 3 results) No results for input(s): PROBNP in the last 8760 hours.  CBG: No results for input(s): GLUCAP in the last 168 hours.     SignedTracie Harrier   01/17/2015, 2:36 PM

## 2015-01-17 NOTE — Progress Notes (Signed)
ANTIBIOTIC CONSULT NOTE - FOLLOW UP  Pharmacy Consult for Levaquin Indication: pneumonia  No Known Allergies  Patient Measurements: Height: 5\' 9"  (175.3 cm) Weight: 152 lb 11.2 oz (69.264 kg) IBW/kg (Calculated) : 70.7   Vital Signs: Temp: 98.5 F (36.9 C) (07/29 0429) Temp Source: Oral (07/29 0429) BP: 131/75 mmHg (07/29 0429) Pulse Rate: 87 (07/29 0429) Intake/Output from previous day: 07/28 0701 - 07/29 0700 In: 240 [P.O.:240] Out: 550 [Urine:550] Intake/Output from this shift: Total I/O In: -  Out: 400 [Urine:400]  Labs:  Recent Labs  01/15/15 0424 01/16/15 0424 01/17/15 0457  WBC 5.4 6.0 5.1  HGB 8.3* 8.3* 8.3*  PLT 117* 111* 104*  CREATININE 0.95 0.96 0.77   Estimated Creatinine Clearance: 81.8 mL/min (by C-G formula based on Cr of 0.77). No results for input(s): VANCOTROUGH, VANCOPEAK, VANCORANDOM, GENTTROUGH, GENTPEAK, GENTRANDOM, TOBRATROUGH, TOBRAPEAK, TOBRARND, AMIKACINPEAK, AMIKACINTROU, AMIKACIN in the last 72 hours.   Microbiology: Recent Results (from the past 720 hour(s))  Blood culture (routine x 2)     Status: None (Preliminary result)   Collection Time: 01/12/15  8:03 PM  Result Value Ref Range Status   Specimen Description BLOOD LEFT ARM  Final   Special Requests BOTTLES DRAWN AEROBIC AND ANAEROBIC 5CC  Final   Culture NO GROWTH 4 DAYS  Final   Report Status PENDING  Incomplete  Blood culture (routine x 2)     Status: None   Collection Time: 01/12/15  9:24 PM  Result Value Ref Range Status   Specimen Description BLOOD LEFT ARM  Final   Special Requests BOTTLES DRAWN AEROBIC AND ANAEROBIC 5CC  Final   Culture  Setup Time   Final    GRAM POSITIVE COCCI AEROBIC BOTTLE ONLY CRITICAL RESULT CALLED TO, READ BACK BY AND VERIFIED WITH: CALLED TO LISA ROMERO @ 2219 ON 01/13/2015 [facility] CONFIRMED BY [facility]    Culture   Final    COAGULASE NEGATIVE STAPHYLOCOCCUS AEROBIC BOTTLE ONLY POSSIBLE CONTAMINATION WITH SKIN FLORA    Report Status 01/17/2015  FINAL  Final    Anti-infectives    Start     Dose/Rate Route Frequency Ordered Stop   01/17/15 1800  levofloxacin (LEVAQUIN) tablet 750 mg     750 mg Oral Daily 01/17/15 0804     01/13/15 2300  levofloxacin (LEVAQUIN) IVPB 750 mg  Status:  Discontinued     750 mg 100 mL/hr over 90 Minutes Intravenous Every 24 hours 01/13/15 2250 01/17/15 0804   01/13/15 0600  levofloxacin (LEVAQUIN) tablet 500 mg  Status:  Discontinued     500 mg Oral Daily 01/13/15 0303 01/13/15 2249      Assessment: Pharmacy consulted to dose Levaquin in a 74 yo male admitted for shortness of breath.  Respiratory distress secondary COPD exacerbation.   SCr: 0.77 (0.8), est CrCl~81.8 mL/min   Goal of Therapy:  Resolution of infection  Plan:  Will continue patient on Levaquin 750 mg po daily.  Transitioned patent to oral Levaquin based on criteria for IV to PO policy.   Pharmacy will continue to follow.  Marche Hottenstein G 01/17/2015,8:05 AM

## 2015-01-17 NOTE — Clinical Social Work Note (Signed)
Clinical Social Work Assessment  Patient Details  Name: Charles Rose MRN: [id number] Date of Birth: 12/12/1940  Date of referral:  01/17/15               Reason for consult:  Facility Placement                Permission sought to share information with:  Facility Sport and exercise psychologist, Family Supports Permission granted to share information::  Yes, Verbal Permission Granted  Name::        Agency::     Relationship::     Contact Information:     Housing/Transportation Living arrangements for the past 2 months:  Single Family Home Source of Information:  Patient, Medical Team Patient Interpreter Needed:  None Criminal Activity/Legal Involvement Pertinent to Current Situation/Hospitalization:  No - Comment as needed Significant Relationships:  Siblings Lives with:  Self Do you feel safe going back to the place where you live?  Yes Need for family participation in patient care:  No (Coment)  Care giving concerns:  Pt was concerned about going back home and being able to care for himself.  He is willing to go to SNF   Social Worker assessment / plan:  CSW spoke to pt.  He was alert and Ox4.  He is in agreement with DC to SNF.  Pt confirmed that he lived alone, but his siblings (sister) Charles Rose [phone] was his closest contact.  Pt prefers [place] if possible.  Employment status:  Retired, Disabled (Comment on whether or not currently receiving Disability) Insurance information:  Medicare PT Recommendations:  [name] / Referral to community resources:  [place]  Patient/Family's Response to care:  Pt was in agreement with SNF placement  Patient/Family's Understanding of and Emotional Response to Diagnosis, Current Treatment, and Prognosis: Pt verbalized understanding and was in agreement with SNF placement.    Emotional Assessment Appearance:  Appears stated age Attitude/Demeanor/Rapport:   (pleasent) Affect (typically observed):    (pleasent) Orientation:  Oriented to Self, Oriented to Place, Oriented to  Time, Oriented to Situation Alcohol / Substance use:  Never Used Psych involvement (Current and /or in the community):  No (Comment)  Discharge Needs  Concerns to be addressed:  Care Coordination Readmission within the last 30 days:  No Current discharge risk:  Physical Impairment Barriers to Discharge:  No Barriers Identified   [name], LCSW 01/17/2015, 10:22 AM

## 2015-01-17 NOTE — Progress Notes (Signed)
2 L of oxygen. NSR. Takes meds ok. A & O. Family at the bedside. 1 assist BSC. Urinal. IV abx changed to PO. IV and tele removed. Discharge instructions given to pt. Family was updated. Pt has no further concerns at this times. Report called to [employer], Therapist, sports at [employer].

## 2015-01-17 NOTE — Progress Notes (Signed)
PROGRESS NOTE  Charles Rose DOB: March 08, 1941 DOA: 01/12/2015 PCP: Tracie Harrier, MD   [name]:  74  Y/o male  With hx of COPD, Chronic respiratory failure,Ca esophagus, HTN, Anemia admitted with progressive shortness of breath and a dry cough. Pt improved with IV Solumedrol and SVN;s This am states he is feeling bettter     Objective: BP 131/75 mmHg  Pulse 87  Temp(Src) 98.5 F (36.9 C) (Oral)  Resp 20  Ht 5\' 9"  (1.753 m)  Wt 69.264 kg (152 lb 11.2 oz)  BMI 22.54 kg/m2  SpO2 98%  Intake/Output Summary (Last 24 hours) at 01/17/15 0820 Last data filed at 01/17/15 0744  Gross per 24 hour  Intake    240 ml  Output    950 ml  Net   -710 ml   Filed Weights   01/12/15 2219 01/13/15 0104  Weight: 69.31 kg (152 lb 12.8 oz) 69.264 kg (152 lb 11.2 oz)    Exam: HEENT; [place], AT Pt is dyspneic at rest . Able to speak in full sentences  General: Pale mucosa  Cardiovascular: S1 S2  Respiratory: Bilat inspiratory and expiratory wheezes +  Abdomen: Soft. Non tender  Neuro:Non Focal   Data Reviewed: Basic Metabolic Panel:  Recent Labs Lab 01/13/15 0528 01/14/15 0459 01/15/15 0424 01/16/15 0424 01/17/15 0457  NA 140 139 144 143 142  K 4.4 4.1 4.1 4.5 4.3  CL 105 104 107 108 107  CO2 25 28 31 31 31   GLUCOSE 153* 149* 137* 135* 126*  BUN 17 21* 31* 28* 24*  CREATININE 0.87 0.92 0.95 0.96 0.77  CALCIUM 8.5* 8.8* 8.9 8.6* 8.7*   Liver Function Tests:  Recent Labs Lab 01/12/15 2002 01/15/15 0424  AST 40 22  ALT 19 18  ALKPHOS 60 55  BILITOT 0.8 0.6  PROT 6.5 5.6*  ALBUMIN 3.9 3.3*   No results for input(s): LIPASE, AMYLASE in the last 168 hours. No results for input(s): AMMONIA in the last 168 hours. CBC:  Recent Labs Lab 01/12/15 2124 01/13/15 0528 01/14/15 0459 01/15/15 0424 01/16/15 0424 01/17/15 0457  WBC 8.2 2.8* 6.0 5.4 6.0 5.1  NEUTROABS 7.0*  --  5.5 4.9 5.3 4.4  HGB 8.9* 8.8* 8.8* 8.3* 8.3* 8.3*  HCT 28.0*  27.6* 28.0* 26.4* 26.4* 26.5*  MCV 65.7* 65.8* 65.5* 65.6* 65.4* 65.4*  PLT 121* 112* 119* 117* 111* 104*   Cardiac Enzymes:    Recent Labs Lab 01/12/15 2002  TROPONINI <0.03   BNP (last 3 results)  Recent Labs  01/12/15 2021  BNP 26.0    ProBNP (last 3 results) No results for input(s): PROBNP in the last 8760 hours.  CBG: No results for input(s): GLUCAP in the last 168 hours.  Recent Results (from the past 240 hour(s))  Blood culture (routine x 2)     Status: None (Preliminary result)   Collection Time: 01/12/15  8:03 PM  Result Value Ref Range Status   Specimen Description BLOOD LEFT ARM  Final   Special Requests BOTTLES DRAWN AEROBIC AND ANAEROBIC 5CC  Final   Culture NO GROWTH 4 DAYS  Final   Report Status PENDING  Incomplete  Blood culture (routine x 2)     Status: None   Collection Time: 01/12/15  9:24 PM  Result Value Ref Range Status   Specimen Description BLOOD LEFT ARM  Final   Special Requests BOTTLES DRAWN AEROBIC AND ANAEROBIC 5CC  Final   Culture  Setup Time   Final  GRAM POSITIVE COCCI AEROBIC BOTTLE ONLY CRITICAL RESULT CALLED TO, READ BACK BY AND VERIFIED WITH: CALLED TO LISA ROMERO @ 2219 ON 01/13/2015 CAF CONFIRMED BY [facility]    Culture   Final    COAGULASE NEGATIVE STAPHYLOCOCCUS AEROBIC BOTTLE ONLY POSSIBLE CONTAMINATION WITH SKIN FLORA    Report Status 01/17/2015 FINAL  Final     Studies: No results found.  Scheduled Meds: . aspirin EC  81 mg Oral Daily  . calcium carbonate  1 tablet Oral Q breakfast  . clopidogrel  75 mg Oral Daily  . docusate sodium  100 mg Oral BID  . enoxaparin (LOVENOX) injection  40 mg Subcutaneous Q24H  . famotidine  20 mg Oral Daily  . feeding supplement (ENSURE ENLIVE)  237 mL Oral BID BM  . ferrous sulfate  325 mg Oral Q breakfast  . isosorbide mononitrate  30 mg Oral Daily  . levofloxacin  750 mg Oral Daily  . methylPREDNISolone (SOLU-MEDROL) injection  40 mg Intravenous 3 times per day  . omega-3 acid  ethyl esters  1 g Oral Daily  . simvastatin  40 mg Oral q1800    Continuous Infusions:   Assessment/Plan:  1 Acute Respiratory distress secondary to COPD exacerbation; On IV Solumedrol. O2  and Duonebs CXR: Negative for Infiltrate: On Levaquin 2 Hyperkalemia; Improved 3 CAD- hx of stent: On Plavix, Isosorbide and statins 4 Anxiety: On Xanax  5 GERD; On Famotidine  6 Neck and back pain: MRI- Anterolisthesis C3-C4 - Chronic and evidence for cervical spondylosis and compression deformities T 7  and T11 For Rehab soon   Code Status: Full      Charles Rose   01/17/2015, 8:20 AM  LOS: 5 days               PROGRESS NOTE  Charles Rose XKG:[id number] DOB: 04/18/1941 DOA: 01/12/2015 PCP: Tracie Harrier, MD   Subjctive:  74  Y/o male  With hx of COPD, Chronic respiratory failure,Ca esophagus, HTN, Anemia admitted with progressive shortness of breath and a dry cough. Pt improved with IV Solumedrol and BIPAP. This am had episode of chest pain releived with Zantac. Also more short of breath with increased wheezing      Objective: BP 131/75 mmHg  Pulse 87  Temp(Src) 98.5 F (36.9 C) (Oral)  Resp 20  Ht 5\' 9"  (1.753 m)  Wt 69.264 kg (152 lb 11.2 oz)  BMI 22.54 kg/m2  SpO2 98%  Intake/Output Summary (Last 24 hours) at 01/17/15 0820 Last data filed at 01/17/15 0744  Gross per 24 hour  Intake    240 ml  Output    950 ml  Net   -710 ml   Filed Weights   01/12/15 2219 01/13/15 0104  Weight: 69.31 kg (152 lb 12.8 oz) 69.264 kg (152 lb 11.2 oz)    Exam: HEENT; [place], AT Pt is dyspneic at rest and using accessory muscles of respiration  General: Pale mucosa  Cardiovascular: S1 S2  Respiratory: Bilat inspiratory and expiratory wheezes +  Abdomen: Soft. Non tender  Neuro:Non Focal   Data Reviewed: Basic Metabolic Panel:  Recent Labs Lab 01/13/15 0528 01/14/15 0459 01/15/15 0424 01/16/15 0424 01/17/15 0457  NA 140 139 144 143 142  K  4.4 4.1 4.1 4.5 4.3  CL 105 104 107 108 107  CO2 25 28 31 31 31   GLUCOSE 153* 149* 137* 135* 126*  BUN 17 21* 31* 28* 24*  CREATININE 0.87 0.92 0.95 0.96 0.77  CALCIUM 8.5* 8.8* 8.9  8.6* 8.7*   Liver Function Tests:  Recent Labs Lab 01/12/15 2002 01/15/15 0424  AST 40 22  ALT 19 18  ALKPHOS 60 55  BILITOT 0.8 0.6  PROT 6.5 5.6*  ALBUMIN 3.9 3.3*   No results for input(s): LIPASE, AMYLASE in the last 168 hours. No results for input(s): AMMONIA in the last 168 hours. CBC:  Recent Labs Lab 01/12/15 2124 01/13/15 0528 01/14/15 0459 01/15/15 0424 01/16/15 0424 01/17/15 0457  WBC 8.2 2.8* 6.0 5.4 6.0 5.1  NEUTROABS 7.0*  --  5.5 4.9 5.3 4.4  HGB 8.9* 8.8* 8.8* 8.3* 8.3* 8.3*  HCT 28.0* 27.6* 28.0* 26.4* 26.4* 26.5*  MCV 65.7* 65.8* 65.5* 65.6* 65.4* 65.4*  PLT 121* 112* 119* 117* 111* 104*   Cardiac Enzymes:    Recent Labs Lab 01/12/15 2002  TROPONINI <0.03   BNP (last 3 results)  Recent Labs  01/12/15 2021  BNP 26.0    ProBNP (last 3 results) No results for input(s): PROBNP in the last 8760 hours.  CBG: No results for input(s): GLUCAP in the last 168 hours.  Recent Results (from the past 240 hour(s))  Blood culture (routine x 2)     Status: None (Preliminary result)   Collection Time: 01/12/15  8:03 PM  Result Value Ref Range Status   Specimen Description BLOOD LEFT ARM  Final   Special Requests BOTTLES DRAWN AEROBIC AND ANAEROBIC 5CC  Final   Culture NO GROWTH 4 DAYS  Final   Report Status PENDING  Incomplete  Blood culture (routine x 2)     Status: None   Collection Time: 01/12/15  9:24 PM  Result Value Ref Range Status   Specimen Description BLOOD LEFT ARM  Final   Special Requests BOTTLES DRAWN AEROBIC AND ANAEROBIC 5CC  Final   Culture  Setup Time   Final    GRAM POSITIVE COCCI AEROBIC BOTTLE ONLY CRITICAL RESULT CALLED TO, READ BACK BY AND VERIFIED WITH: CALLED TO LISA ROMERO @ 2219 ON 01/13/2015 CAF CONFIRMED BY [facility]    Culture   Final     COAGULASE NEGATIVE STAPHYLOCOCCUS AEROBIC BOTTLE ONLY POSSIBLE CONTAMINATION WITH SKIN FLORA    Report Status 01/17/2015 FINAL  Final     Studies: No results found.  Scheduled Meds: . aspirin EC  81 mg Oral Daily  . calcium carbonate  1 tablet Oral Q breakfast  . clopidogrel  75 mg Oral Daily  . docusate sodium  100 mg Oral BID  . enoxaparin (LOVENOX) injection  40 mg Subcutaneous Q24H  . famotidine  20 mg Oral Daily  . feeding supplement (ENSURE ENLIVE)  237 mL Oral BID BM  . ferrous sulfate  325 mg Oral Q breakfast  . isosorbide mononitrate  30 mg Oral Daily  . levofloxacin  750 mg Oral Daily  . methylPREDNISolone (SOLU-MEDROL) injection  40 mg Intravenous 3 times per day  . omega-3 acid ethyl esters  1 g Oral Daily  . simvastatin  40 mg Oral q1800    Continuous Infusions:   Assessment/Plan:  1 Acute Respiratory distress secondary to COPD exacerbation; On   IV Solumedrol. O2 and Duonebs Pt is symptomatically better CXR: Negative for Infiltrate: On Levaquin 2 Neck and back pain: MRI cervical spine; Compression deformities T7 and T11 and Evidence for cervical spondylosis, spinal stenosis with cord compression C3- C4 C4-C5 and C5-C6 3 Hyperkalemia; Improved 4 CAD- hx of stent: On Plavix, Isosorbide and statins 5 Anxiety: On Xanax  6 GERD; On Famotidine  For Rehab soon   Code Status: Full      Charles Rose   01/17/2015, 8:20 AM  LOS: 5 days

## 2015-01-17 NOTE — Clinical Social Work Placement (Signed)
   CLINICAL SOCIAL WORK PLACEMENT  NOTE  Date:  01/17/2015  Patient Details  Name: Sharod Petsch MRN: [id number] Date of Birth: 1941-03-11  Clinical Social Work is seeking post-discharge placement for this patient at the [place] level of care (*CSW will initial, date and re-position this form in  chart as items are completed):  Yes   Patient/family provided with [facility] Work Department's list of facilities offering this level of care within the geographic area requested by the patient (or if unable, by the patient's family).  Yes   Patient/family informed of their freedom to choose among providers that offer the needed level of care, that participate in Medicare, Medicaid or managed care program needed by the patient, have an available bed and are willing to accept the patient.  Yes   Patient/family informed of [facility]'s ownership interest in [facility] and [facility], as well as of the fact that they are under no obligation to receive care at these facilities.  PASRR submitted to EDS on 01/17/15     PASRR number received on 01/17/15     Existing PASRR number confirmed on       FL2 transmitted to all facilities in geographic area requested by pt/family on 01/17/15     FL2 transmitted to all facilities within larger geographic area on       Patient informed that his/her managed care company has contracts with or will negotiate with certain facilities, including the following:   (pt was given SNF packet with this information on it)         Patient/family informed of bed offers received.  Patient chooses bed at       Physician recommends and patient chooses bed at      Patient to be transferred to   on  .  Patient to be transferred to facility by       Patient family notified on   of transfer.  Name of family member notified:        PHYSICIAN Please sign FL2     Additional Comment:     _______________________________________________ [name], LCSW 01/17/2015, 10:26 AM

## 2015-02-28 ENCOUNTER — Encounter: Payer: Self-pay | Admitting: Radiology

## 2015-02-28 ENCOUNTER — Emergency Department: Payer: Medicare Other

## 2015-02-28 ENCOUNTER — Observation Stay
Admission: EM | Admit: 2015-02-28 | Discharge: 2015-03-02 | Disposition: A | Discharge status: Home / Self Care | Payer: Medicare Other | Attending: Internal Medicine | Admitting: Internal Medicine

## 2015-02-28 DIAGNOSIS — Z87891 Personal history of nicotine dependence: Secondary | ICD-10-CM | POA: Diagnosis not present

## 2015-02-28 DIAGNOSIS — Z7982 Long term (current) use of aspirin: Secondary | ICD-10-CM | POA: Diagnosis not present

## 2015-02-28 DIAGNOSIS — J479 Bronchiectasis, uncomplicated: Secondary | ICD-10-CM | POA: Diagnosis present

## 2015-02-28 DIAGNOSIS — D696 Thrombocytopenia, unspecified: Secondary | ICD-10-CM | POA: Insufficient documentation

## 2015-02-28 DIAGNOSIS — R29890 Loss of height: Secondary | ICD-10-CM | POA: Diagnosis not present

## 2015-02-28 DIAGNOSIS — I10 Essential (primary) hypertension: Secondary | ICD-10-CM | POA: Diagnosis not present

## 2015-02-28 DIAGNOSIS — Z833 Family history of diabetes mellitus: Secondary | ICD-10-CM | POA: Insufficient documentation

## 2015-02-28 DIAGNOSIS — I2510 Atherosclerotic heart disease of native coronary artery without angina pectoris: Secondary | ICD-10-CM | POA: Diagnosis not present

## 2015-02-28 DIAGNOSIS — Z7951 Long term (current) use of inhaled steroids: Secondary | ICD-10-CM | POA: Insufficient documentation

## 2015-02-28 DIAGNOSIS — J441 Chronic obstructive pulmonary disease with (acute) exacerbation: Secondary | ICD-10-CM | POA: Insufficient documentation

## 2015-02-28 DIAGNOSIS — Z9981 Dependence on supplemental oxygen: Secondary | ICD-10-CM | POA: Diagnosis not present

## 2015-02-28 DIAGNOSIS — I739 Peripheral vascular disease, unspecified: Secondary | ICD-10-CM | POA: Diagnosis not present

## 2015-02-28 DIAGNOSIS — E785 Hyperlipidemia, unspecified: Secondary | ICD-10-CM | POA: Diagnosis not present

## 2015-02-28 DIAGNOSIS — D500 Iron deficiency anemia secondary to blood loss (chronic): Secondary | ICD-10-CM | POA: Insufficient documentation

## 2015-02-28 DIAGNOSIS — R042 Hemoptysis: Secondary | ICD-10-CM | POA: Diagnosis not present

## 2015-02-28 DIAGNOSIS — R05 Cough: Secondary | ICD-10-CM | POA: Insufficient documentation

## 2015-02-28 DIAGNOSIS — J45909 Unspecified asthma, uncomplicated: Secondary | ICD-10-CM | POA: Insufficient documentation

## 2015-02-28 DIAGNOSIS — K219 Gastro-esophageal reflux disease without esophagitis: Secondary | ICD-10-CM | POA: Diagnosis not present

## 2015-02-28 DIAGNOSIS — F419 Anxiety disorder, unspecified: Secondary | ICD-10-CM | POA: Insufficient documentation

## 2015-02-28 DIAGNOSIS — Z7902 Long term (current) use of antithrombotics/antiplatelets: Secondary | ICD-10-CM | POA: Diagnosis not present

## 2015-02-28 DIAGNOSIS — Z79899 Other long term (current) drug therapy: Secondary | ICD-10-CM | POA: Diagnosis not present

## 2015-02-28 DIAGNOSIS — Z955 Presence of coronary angioplasty implant and graft: Secondary | ICD-10-CM | POA: Diagnosis not present

## 2015-02-28 DIAGNOSIS — Z23 Encounter for immunization: Secondary | ICD-10-CM | POA: Insufficient documentation

## 2015-02-28 DIAGNOSIS — J9811 Atelectasis: Secondary | ICD-10-CM | POA: Diagnosis not present

## 2015-02-28 DIAGNOSIS — J439 Emphysema, unspecified: Secondary | ICD-10-CM | POA: Diagnosis present

## 2015-02-28 LAB — COMPREHENSIVE METABOLIC PANEL
ALK PHOS: 89 U/L (ref 38–126)
ALT: 15 U/L — AB (ref 17–63)
AST: 23 U/L (ref 15–41)
Albumin: 3.8 g/dL (ref 3.5–5.0)
Anion gap: 5 (ref 5–15)
BUN: 15 mg/dL (ref 6–20)
CALCIUM: 8.9 mg/dL (ref 8.9–10.3)
CHLORIDE: 109 mmol/L (ref 101–111)
CO2: 26 mmol/L (ref 22–32)
CREATININE: 0.9 mg/dL (ref 0.61–1.24)
GFR calc Af Amer: >60 mL/min (ref >60)
Glucose, Bld: 104 mg/dL — ABNORMAL HIGH (ref 65–99)
Potassium: 4.3 mmol/L (ref 3.5–5.1)
SODIUM: 140 mmol/L (ref 135–145)
Total Bilirubin: 0.9 mg/dL (ref 0.3–1.2)
Total Protein: 6.2 g/dL — ABNORMAL LOW (ref 6.5–8.1)

## 2015-02-28 LAB — CBC WITH DIFFERENTIAL/PLATELET
BASOS ABS: 0 10*3/uL (ref 0–0.1)
Basophils Relative: 1 %
EOS PCT: 3 %
Eosinophils Absolute: 0.2 10*3/uL (ref 0–0.7)
HCT: 30.3 % — ABNORMAL LOW (ref 40.0–52.0)
HEMOGLOBIN: 9.5 g/dL — AB (ref 13.0–18.0)
Lymphocytes Relative: 15 %
Lymphs Abs: 1.1 10*3/uL (ref 1.0–3.6)
MCH: 21.1 pg — ABNORMAL LOW (ref 26.0–34.0)
MCHC: 31.5 g/dL — ABNORMAL LOW (ref 32.0–36.0)
MCV: 66.9 fL — AB (ref 80.0–100.0)
Monocytes Absolute: 0.6 10*3/uL (ref 0.2–1.0)
Monocytes Relative: 8 %
NEUTROS ABS: 5.3 10*3/uL (ref 1.4–6.5)
NEUTROS PCT: 73 %
PLATELETS: 136 10*3/uL — AB (ref 150–440)
RBC: 4.53 MIL/uL (ref 4.40–5.90)
RDW: 16.8 % — ABNORMAL HIGH (ref 11.5–14.5)
WBC: 7.2 10*3/uL (ref 3.8–10.6)

## 2015-02-28 LAB — PROTIME-INR
INR: 1.16
Prothrombin Time: 15 seconds (ref 11.4–15.0)

## 2015-02-28 LAB — APTT: aPTT: 32 seconds (ref 24–36)

## 2015-02-28 MED ORDER — ONDANSETRON HCL 4 MG PO TABS: 4.0000 mg | ORAL_TABLET | Freq: Four times a day (QID) | ORAL | Status: DC | PRN

## 2015-02-28 MED ORDER — ACETAMINOPHEN 650 MG RE SUPP: 650.0000 mg | Freq: Four times a day (QID) | RECTAL | Status: DC | PRN

## 2015-02-28 MED ORDER — ONDANSETRON HCL 4 MG/2ML IJ SOLN: 4.0000 mg | Freq: Four times a day (QID) | INTRAMUSCULAR | Status: DC | PRN

## 2015-02-28 MED ORDER — IOHEXOL 350 MG/ML SOLN
100.0000 mL | Freq: Once | INTRAVENOUS | Status: AC | PRN
  Administered 2015-02-28: 100 mL via INTRAVENOUS

## 2015-02-28 MED ORDER — MORPHINE SULFATE (PF) 2 MG/ML IV SOLN: 2.0000 mg | INTRAVENOUS | Status: DC | PRN

## 2015-02-28 MED ORDER — OXYCODONE HCL 5 MG PO TABS
5.0000 mg | ORAL_TABLET | ORAL | Status: DC | PRN
  Administered 2015-03-01 (×2): 5 mg via ORAL
  Filled 2015-02-28 (×2): qty 1

## 2015-02-28 MED ORDER — IPRATROPIUM-ALBUTEROL 0.5-2.5 (3) MG/3ML IN SOLN
3.0000 mL | RESPIRATORY_TRACT | Status: DC | PRN
  Administered 2015-03-01 – 2015-03-02 (×4): 3 mL via RESPIRATORY_TRACT
  Filled 2015-02-28 (×4): qty 3

## 2015-02-28 MED ORDER — SODIUM CHLORIDE 0.9 % IV BOLUS (SEPSIS)
500.0000 mL | Freq: Once | INTRAVENOUS | Status: AC
Start: 2015-02-28 — End: 2015-02-28
  Administered 2015-02-28: 500 mL via INTRAVENOUS

## 2015-02-28 MED ORDER — GUAIFENESIN-CODEINE 100-10 MG/5ML PO SOLN
10.0000 mL | ORAL | Status: DC | PRN
  Administered 2015-03-01: 21:00:00 10 mL via ORAL
  Filled 2015-02-28: qty 10

## 2015-02-28 MED ORDER — ACETAMINOPHEN 325 MG PO TABS: 650.0000 mg | ORAL_TABLET | Freq: Four times a day (QID) | ORAL | Status: DC | PRN

## 2015-02-28 MED ORDER — IPRATROPIUM-ALBUTEROL 0.5-2.5 (3) MG/3ML IN SOLN
3.0000 mL | Freq: Once | RESPIRATORY_TRACT | Status: AC
  Administered 2015-02-28: 3 mL via RESPIRATORY_TRACT
  Filled 2015-02-28: qty 3

## 2015-02-28 NOTE — H&P (Signed)
[facility] [place] NAME: Charles Rose    MR#:  [id number]  DATE OF BIRTH:  [birth year]   DATE OF ADMISSION:  02/28/2015  PRIMARY CARE PHYSICIAN: Tracie Harrier, MD   REQUESTING/REFERRING PHYSICIAN: Edd Fabian  CHIEF COMPLAINT:   Chief Complaint  Patient presents with  . Hemoptysis    HISTORY OF PRESENT ILLNESS:  Charles Rose  is a 74 y.o. male with a known history of COPD, oxygen dependent presenting with hemoptysis She complains of chronic cough which is been on change in intensity or productiveness, now has one day duration of hemoptysis she describes as 2 episodes small amounts of blood, possibly blood clots. He denies any fevers, chills, chest pain, lightheadedness, shortness of breath or further symptomatology at this time.  PAST MEDICAL HISTORY:   Past Medical History  Diagnosis Date  . Coronary artery disease   . COPD (chronic obstructive pulmonary disease)   . Hyperlipidemia   . Thrombocytopenia   . Anemia   . Anxiety   . Asthma   . Cancer     esophagus    PAST SURGICAL HISTORY:   Past Surgical History  Procedure Laterality Date  . Coronary stent placement      SOCIAL HISTORY:   Social History  Substance Use Topics  . Smoking status: Former Research scientist (life sciences)  . Smokeless tobacco: Not on file  . Alcohol Use: No    FAMILY HISTORY:   Family History  Problem Relation Age of Onset  . Diabetes Other     DRUG ALLERGIES:  No Known Allergies  REVIEW OF SYSTEMS:  REVIEW OF SYSTEMS:  CONSTITUTIONAL: Denies fevers, chills, fatigue, weakness.  EYES: Denies blurred vision, double vision, or eye pain.  EARS, NOSE, THROAT: Denies tinnitus, ear pain, hearing loss.  RESPIRATORY: Positive cough, hemoptysis, denies shortness of breath, wheezing  CARDIOVASCULAR: Denies chest pain, palpitations, edema.  GASTROINTESTINAL: Denies nausea, vomiting, diarrhea, abdominal pain.  GENITOURINARY: Denies dysuria, hematuria.   ENDOCRINE: Denies nocturia or thyroid problems. HEMATOLOGIC AND LYMPHATIC: Denies easy bruising or bleeding.  SKIN: Denies rash or lesions.  MUSCULOSKELETAL: Denies pain in neck, back, shoulder, knees, hips, or further arthritic symptoms.  NEUROLOGIC: Denies paralysis, paresthesias.  PSYCHIATRIC: Denies anxiety or depressive symptoms. Otherwise full review of systems performed by me is negative.   MEDICATIONS AT HOME:   Prior to Admission medications   Medication Sig Start Date End Date Taking? Authorizing Provider  albuterol (PROVENTIL HFA;VENTOLIN HFA) 108 (90 BASE) MCG/ACT inhaler Inhale 2 puffs into the lungs 2 (two) times daily.    Yes Historical Provider, MD  ALPRAZolam (XANAX) 0.25 MG tablet Take 0.25 mg by mouth 2 (two) times daily as needed for anxiety.   Yes Historical Provider, MD  aspirin EC 81 MG tablet Take 81 mg by mouth daily.   Yes Historical Provider, MD  calcium carbonate (OS-CAL - DOSED IN MG OF ELEMENTAL CALCIUM) 1250 (500 CA) MG tablet Take 1 tablet (500 mg of elemental calcium total) by mouth daily with breakfast. 01/17/15  Yes Vishwanath Hande, MD  clopidogrel (PLAVIX) 75 MG tablet Take 75 mg by mouth daily.   Yes Historical Provider, MD  dextromethorphan-guaiFENesin (ROBITUSSIN-DM) 10-100 MG/5ML liquid Take 10 mLs by mouth 2 (two) times daily.   Yes Historical Provider, MD  feeding supplement, ENSURE ENLIVE, (ENSURE ENLIVE) LIQD Take 237 mLs by mouth 2 (two) times daily between meals. 01/17/15  Yes Vishwanath Hande, MD  ferrous sulfate 325 (65 FE) MG tablet Take 325 mg by  mouth daily with breakfast.   Yes Historical Provider, MD  HYDROcodone-acetaminophen (NORCO/VICODIN) 5-325 MG per tablet Take 1 tablet by mouth 2 (two) times daily.   Yes Historical Provider, MD  ipratropium-albuterol (DUONEB) 0.5-2.5 (3) MG/3ML SOLN Take 3 mLs by nebulization 3 (three) times daily.   Yes Historical Provider, MD  isosorbide mononitrate (IMDUR) 30 MG 24 hr tablet Take 30 mg by mouth  daily.   Yes Historical Provider, MD  omega-3 acid ethyl esters (LOVAZA) 1 G capsule Take 1 g by mouth daily.   Yes Historical Provider, MD  ranitidine (ZANTAC) 150 MG tablet Take 150 mg by mouth 2 (two) times daily.   Yes Historical Provider, MD  simvastatin (ZOCOR) 40 MG tablet Take 40 mg by mouth daily at 6 PM.   Yes Historical Provider, MD  nitroGLYCERIN (NITROSTAT) 0.4 MG SL tablet Place 0.4 mg under the tongue every 5 (five) minutes as needed for chest pain.    Historical Provider, MD      VITAL SIGNS:  Blood pressure 129/79, pulse 68, temperature 98.3 F (36.8 C), temperature source Oral, resp. rate 18, height 5\' 9"  (1.753 m), weight 150 lb 3.2 oz (68.13 kg), SpO2 100 %.  PHYSICAL EXAMINATION:  VITAL SIGNS: Filed Vitals:   02/28/15 2330  BP: 129/79  Pulse: 68  Temp:   Resp:    GENERAL:73 y.o.male currently in no acute distress.  HEAD: Normocephalic, atraumatic.  EYES: Pupils equal, round, reactive to light. Extraocular muscles intact. No scleral icterus.  MOUTH: Moist mucosal membrane. Dentition intact. No abscess noted.  EAR, NOSE, THROAT: Clear without exudates. No external lesions.  NECK: Supple. No thyromegaly. No nodules. No JVD.  PULMONARY: Clear to ascultation, without wheeze rails or rhonci. No use of accessory muscles, Good respiratory effort. good air entry bilaterally CHEST: Nontender to palpation.  CARDIOVASCULAR: S1 and S2. Regular rate and rhythm. No murmurs, rubs, or gallops. No edema. Pedal pulses 2+ bilaterally.  GASTROINTESTINAL: Soft, nontender, nondistended. No masses. Positive bowel sounds. No hepatosplenomegaly.  MUSCULOSKELETAL: No swelling, clubbing, or edema. Range of motion full in all extremities.  NEUROLOGIC: Cranial nerves II through XII are intact. No gross focal neurological deficits. Sensation intact. Reflexes intact.  SKIN: No ulceration, lesions, rashes, or cyanosis. Skin warm and dry. Turgor intact.  PSYCHIATRIC: Mood, affect within normal  limits. The patient is awake, alert and oriented x 3. Insight, judgment intact.    LABORATORY PANEL:   CBC  Recent Labs Lab 02/28/15 2049  WBC 7.2  HGB 9.5*  HCT 30.3*  PLT 136*   ------------------------------------------------------------------------------------------------------------------  Chemistries   Recent Labs Lab 02/28/15 2049  NA 140  K 4.3  CL 109  CO2 26  GLUCOSE 104*  BUN 15  CREATININE 0.90  CALCIUM 8.9  AST 23  ALT 15*  ALKPHOS 89  BILITOT 0.9   ------------------------------------------------------------------------------------------------------------------  Cardiac Enzymes No results for input(s): TROPONINI in the last 168 hours. ------------------------------------------------------------------------------------------------------------------  RADIOLOGY:  Dg Chest 2 View  02/28/2015   CLINICAL DATA:  Hemoptysis.  EXAM: CHEST  2 VIEW  COMPARISON:  Chest radiograph of January 12, 2015. MRI scan of January 14, 2015.  FINDINGS: The heart size and mediastinal contours are within normal limits. No pneumothorax or pleural effusion is noted. Stable scarring is noted in both lung bases. There is the interval development of a small air-fluid level in the medial portion of the right lung base. Old compression fracture of mid thoracic vertebral body is noted.  IMPRESSION: Interval development of small air-fluid  level seen medially in right lung base. Small abscess cannot be excluded. CT scan of the chest is recommended for further evaluation.   Electronically Signed   By: Marijo Conception, M.D.   On: 02/28/2015 21:06   Ct Angio Chest Pe W/cm &/or Wo Cm  02/28/2015   CLINICAL DATA:  Coughing up blood twice today. History of COPD. Pneumonia last March.  EXAM: CT ANGIOGRAPHY CHEST WITH CONTRAST  TECHNIQUE: Multidetector CT imaging of the chest was performed using the standard protocol during bolus administration of intravenous contrast. Multiplanar CT image reconstructions  and MIPs were obtained to evaluate the vascular anatomy.  CONTRAST:  155mL OMNIPAQUE IOHEXOL 350 MG/ML SOLN  COMPARISON:  01/01/2015  FINDINGS: Technically adequate study with good opacification of the central and segmental pulmonary arteries. No focal filling defects demonstrated. No evidence of significant pulmonary embolus.  Normal heart size. Normal caliber thoracic aorta. No aortic dissection. Scattered calcifications in the aorta. Coronary artery calcifications. Esophagus is decompressed with mild thickening of the wall of the distal esophagus, nonspecific. Scattered lymph nodes in the mediastinum are not pathologically enlarged.  Diffuse emphysema throughout the lungs. Scarring and fibrosis throughout the lungs. Bronchiectasis most prominent in the lung bases with peribronchial thickening consistent with chronic bronchitis. Focal cystic bronchiectasis in the right lung base with air-fluid level. Atelectasis in the lung bases. No focal consolidation. No pneumothorax. No pleural effusions.  Included portions of the upper abdominal organs are grossly unremarkable. Compression fractures at T11 and L1 appears stable since prior study. Compression fracture at T7 with about 40% loss of height is progressing since previous study. There is sclerosis in the T7 and T11 vertebrae. Changes are likely due to osteoporosis but metastasis is not entirely excluded. Correlate with clinical history.  Review of the MIP images confirms the above findings.  IMPRESSION: No evidence of significant pulmonary embolus. Diffuse emphysematous changes, fibrosis, and chronic bronchitic changes in the lungs with bronchiectasis. Cystic area of bronchiectasis in the right lower lung with air-fluid level. This may represent source of hemorrhage. No focal consolidation in the lungs. Unchanged appearance of compression fractures at T11 and L1. Progression of compression fracture at T7 with increased sclerosis. Findings may be due to osteoporosis  but metastatic disease not excluded.   Electronically Signed   By: Lucienne Capers M.D.   On: 02/28/2015 22:43    EKG:   Orders placed or performed during the hospital encounter of 01/12/15  . ED EKG  . ED EKG    IMPRESSION AND PLAN:   74 year old gentleman history of COPD oxygen dependent presenting with hemoptysis  1. Hemoptysis: In the setting of chronic cough with COPD, there is noted of these fluid collection on CAT scan, suspect that hemoptysis is related to aspirin/Plavix therapy with chronic cough. No indication for antibiotics at this time, we'll consult pulmonary follows with Dr. Raul Del, follow CBC, provide medications for cough as required 2. GERD without esophagitis: Pepcid 3. Coronary artery disease history of stent placement: We'll hold aspirin/Plavix this evening varies are taken the doses regularly restart in the morning 4. Venous thromboembolism prophylactic: SCDs given hemoptysis  All the records are reviewed and case discussed with ED provider. Management plans discussed with the patient, family and they are in agreement.  CODE STATUS: Full  TOTAL TIME TAKING CARE OF THIS PATIENT: 35 minutes.    Hower,  Karenann Cai.D on 03/01/2015 at 12:02 AM  Between 7am to 6pm - Pager - [phone]  After 6pm: House Pager: -  [facility]  Office  [phone]  CC: Primary care physician; Tracie Harrier, MD

## 2015-02-28 NOTE — ED Notes (Signed)
Pt. States he coughed up twice today.  Pt. States difficult to produce sputum.  Pt. States hx of COPD.  Pt. Also states having pneumonia this past march.

## 2015-02-28 NOTE — ED Notes (Signed)
Patient transported to CT 

## 2015-02-28 NOTE — ED Provider Notes (Signed)
[facility] Emergency Department Provider Note  ____________________________________________  Time seen: Approximately 9:10 PM  I have reviewed the triage vital signs and the nursing notes.   HISTORY  Chief Complaint Hemoptysis    HPI Charles Rose is a 74 y.o. male with history of COPD with 2 L home oxygen requirement, hyperlipidemia, hypertension, anemia who presents for evaluation of small volume hemoptysis, gradual onset, intermittent today. The patient reports that he has been coughing but today he coughed up 2 small blood clots.He denies any large volume hemoptysis. He denies any chest pain or any increased shortness of breath/change from baseline. No recent travel outside of the [place], no unintentional weight loss or night sweats. No modifying factors. Current severity of symptoms is mild. He takes Plavix and 81 mg aspirin daily but is otherwise  not chronically anticoagulated. 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   Past Medical History  Diagnosis Date  . Coronary artery disease   . COPD (chronic obstructive pulmonary disease)   . Hyperlipidemia   . Thrombocytopenia   . Anemia   . Anxiety   . Asthma   . Cancer     esophagus    Patient Active Problem List   Diagnosis Date Noted  . COPD with acute exacerbation 01/12/2015    Past Surgical History  Procedure Laterality Date  . Coronary stent placement      Current Outpatient Rx  Name  Route  Sig  Dispense  Refill  . albuterol (PROVENTIL HFA;VENTOLIN HFA) 108 (90 BASE) MCG/ACT inhaler   Inhalation   Inhale 2 puffs into the lungs every 6 (six) hours as needed for wheezing or shortness of breath.         . ALPRAZolam (XANAX) 0.25 MG tablet   Oral   Take 0.25 mg by mouth 2 (two) times daily as needed for anxiety.         Marland Kitchen alum & mag hydroxide-simeth (MAALOX/MYLANTA) 200-200-20  MG/5ML suspension   Oral   Take 30 mLs by mouth every 6 (six) hours as needed for indigestion or heartburn (dyspepsia).   355 mL   0   . aspirin 81 MG tablet   Oral   Take 81 mg by mouth daily.         . calcium carbonate (OS-CAL - DOSED IN MG OF ELEMENTAL CALCIUM) 1250 (500 CA) MG tablet   Oral   Take 1 tablet (500 mg of elemental calcium total) by mouth daily with breakfast.   60 tablet   5   . clopidogrel (PLAVIX) 75 MG tablet   Oral   Take 75 mg by mouth daily.         . feeding supplement, ENSURE ENLIVE, (ENSURE ENLIVE) LIQD   Oral   Take 237 mLs by mouth 2 (two) times daily between meals.   237 mL   12   . ferrous sulfate 325 (65 FE) MG tablet   Oral   Take 325 mg by mouth daily with breakfast.         . ipratropium-albuterol (DUONEB) 0.5-2.5 (3) MG/3ML SOLN   Nebulization   Take 3 mLs by nebulization 3 (three) times daily.         . isosorbide mononitrate (IMDUR) 30 MG 24 hr tablet   Oral   Take 30 mg by mouth daily.         Marland Kitchen levofloxacin (LEVAQUIN) 500 MG tablet  Oral   Take 1 tablet (500 mg total) by mouth daily.   10 tablet   0   . nitroGLYCERIN (NITROSTAT) 0.4 MG SL tablet   Sublingual   Place 0.4 mg under the tongue every 5 (five) minutes as needed for chest pain.         Marland Kitchen omega-3 acid ethyl esters (LOVAZA) 1 G capsule   Oral   Take 1 g by mouth daily.         Marland Kitchen oxyCODONE (OXY IR/ROXICODONE) 5 MG immediate release tablet   Oral   Take 1 tablet (5 mg total) by mouth 3 (three) times daily as needed for moderate pain.   30 tablet   0   . polyethylene glycol (MIRALAX / GLYCOLAX) packet   Oral   Take 17 g by mouth daily as needed for mild constipation.   14 each   0   . predniSONE (DELTASONE) 10 MG tablet      Prednisone 10 mg tabs  Take 4 tabs po once daily for 3 days Take 3 tabs po once daily for 3 days Take 2 tabs po once daily for 3 days Take 1 tab po once daily for 3 days And then stop  Total: 30 tablets   30  tablet   0   . ranitidine (ZANTAC) 150 MG tablet   Oral   Take 150 mg by mouth 2 (two) times daily.         . simvastatin (ZOCOR) 40 MG tablet   Oral   Take 40 mg by mouth daily at 6 PM.         . zolpidem (AMBIEN) 5 MG tablet   Oral   Take 1 tablet (5 mg total) by mouth at bedtime as needed for sleep.   30 tablet   0     Allergies Review of patient's allergies indicates no known allergies.  No family history on file.  Social History Social History  Substance Use Topics  . Smoking status: Former Research scientist (life sciences)  . Smokeless tobacco: None  . Alcohol Use: No    Review of Systems Constitutional: No fever/chills Eyes: No visual changes. ENT: No sore throat. Cardiovascular: Denies chest pain. Respiratory: + chronic shortness of breath. Gastrointestinal: No abdominal pain.  No nausea, no vomiting.  No diarrhea.  No constipation. Genitourinary: Negative for dysuria. Musculoskeletal: Negative for back pain. Skin: Negative for rash. Neurological: Negative for headaches, focal weakness or numbness.  10-point ROS otherwise negative.  ____________________________________________   PHYSICAL EXAM:  VITAL SIGNS: ED Triage Vitals  Enc Vitals Group     BP 02/28/15 2012 119/67 mmHg     Pulse Rate 02/28/15 2012 75     Resp 02/28/15 2012 18     Temp 02/28/15 2012 98.3 F (36.8 C)     Temp Source 02/28/15 2012 Oral     SpO2 02/28/15 2012 97 %     Weight 02/28/15 2012 150 lb 3.2 oz (68.13 kg)     Height 02/28/15 2012 5\' 9"  (1.753 m)     Head Cir --      Peak Flow --      Pain Score 02/28/15 2016 4     Pain Loc --      Pain Edu? --      Excl. in [place]? --     Constitutional: Alert and oriented. Nontoxic appearing and in no acute distress. Eyes: Conjunctivae are normal. PERRL. EOMI. Head: Atraumatic. Nose: No congestion/rhinnorhea. Mouth/Throat: Mucous membranes are moist.  Oropharynx non-erythematous. Neck: No stridor.   Cardiovascular: Normal rate, regular rhythm.  Grossly normal heart sounds.  Good peripheral circulation. Respiratory: Normal respiratory effort.  No retractions. Diminished breath sounds in bilateral bases. Faint expiratory wheeze. Gastrointestinal: Soft and nontender. No distention. No abdominal bruits. No CVA tenderness. Genitourinary: deferred Musculoskeletal: No lower extremity tenderness nor edema.  No joint effusions. Neurologic:  Normal speech and language. No gross focal neurologic deficits are appreciated. No gait instability. Skin:  Skin is warm, dry and intact. No rash noted. Psychiatric: Mood and affect are normal. Speech and behavior are normal.  ____________________________________________   LABS (all labs ordered are listed, but only abnormal results are displayed)  Labs Reviewed  CBC WITH DIFFERENTIAL/PLATELET - Abnormal; Notable for the following:    Hemoglobin 9.5 (*)    HCT 30.3 (*)    MCV 66.9 (*)    MCH 21.1 (*)    MCHC 31.5 (*)    RDW 16.8 (*)    Platelets 136 (*)    All other components within normal limits  COMPREHENSIVE METABOLIC PANEL - Abnormal; Notable for the following:    Glucose, Bld 104 (*)    Total Protein 6.2 (*)    ALT 15 (*)    All other components within normal limits  CULTURE, BLOOD (ROUTINE X 2)  CULTURE, BLOOD (ROUTINE X 2)  APTT  PROTIME-INR   ____________________________________________  EKG  none ____________________________________________  RADIOLOGY  CXR  IMPRESSION: Interval development of small air-fluid level seen medially in right lung base. Small abscess cannot be excluded. CT scan of the chest is recommended for further evaluation.  CTA chest  IMPRESSION: No evidence of significant pulmonary embolus. Diffuse emphysematous changes, fibrosis, and chronic bronchitic changes in the lungs with bronchiectasis. Cystic area of bronchiectasis in the right lower lung with air-fluid level. This may represent source of hemorrhage. No focal consolidation in the lungs.  Unchanged appearance of compression fractures at T11 and L1. Progression of compression fracture at T7 with increased sclerosis. Findings may be due to osteoporosis but metastatic disease not excluded.   ____________________________________________   PROCEDURES  Procedure(s) performed: None  Critical Care performed: No  ____________________________________________   INITIAL IMPRESSION / ASSESSMENT AND PLAN / ED COURSE  Pertinent labs & imaging results that were available during my care of the patient were reviewed by me and considered in my medical decision making (see chart for details).  Charles Rose is a 74 y.o. male with history of COPD with 2 L home oxygen requirement, hyperlipidemia, hypertension, anemia who presents for evaluation of small volume hemoptysis, gradual onset, intermittent today. On exam, he is nontoxic appearing and in no acute distress. Vital signs stable he is afebrile. Satting 97% on his home 2 L oxygen requirement. He has diminished breath sounds in bilateral bases. Chest x-ray with new air-fluid level in the left lung base and CTA chest recommended to evaluate for empyema. CTA chest pending. Labs are notable for chronic anemia however hemoglobin today is 9.5, improved from 1 month ago. Normal CMP. Normal coags. Reassess for disposition.  ----------------------------------------- 11:12 PM on 02/28/2015 -----------------------------------------  CTA chest negative for empyema however there is focal cystic bronchiectasis in the right lung base with a notable air-fluid level which could represent the source of hemorrhage. Given small volume hemoptysis, CTA findings, he he is at higher risk for large volume hemoptysis and I feel he requires admission. I have discussed the case with the hospitalist for admission at this time. 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

## 2015-02-28 NOTE — ED Notes (Signed)
Pt to ED c/o bloody sputum when he coughs. Denies any unintentional weight loss or waking sweaty. Pt currently on O2 via [place] at 2L.

## 2015-03-01 DIAGNOSIS — D500 Iron deficiency anemia secondary to blood loss (chronic): Secondary | ICD-10-CM | POA: Diagnosis present

## 2015-03-01 DIAGNOSIS — R042 Hemoptysis: Secondary | ICD-10-CM | POA: Diagnosis not present

## 2015-03-01 DIAGNOSIS — I2510 Atherosclerotic heart disease of native coronary artery without angina pectoris: Secondary | ICD-10-CM | POA: Diagnosis present

## 2015-03-01 DIAGNOSIS — J439 Emphysema, unspecified: Secondary | ICD-10-CM | POA: Diagnosis present

## 2015-03-01 LAB — CBC
HCT: 26.9 % — ABNORMAL LOW (ref 40.0–52.0)
Hemoglobin: 8.4 g/dL — ABNORMAL LOW (ref 13.0–18.0)
MCH: 20.9 pg — ABNORMAL LOW (ref 26.0–34.0)
MCHC: 31.2 g/dL — ABNORMAL LOW (ref 32.0–36.0)
MCV: 67 fL — AB (ref 80.0–100.0)
PLATELETS: 123 10*3/uL — AB (ref 150–440)
RBC: 4.02 MIL/uL — AB (ref 4.40–5.90)
RDW: 16.6 % — ABNORMAL HIGH (ref 11.5–14.5)
WBC: 5.3 10*3/uL (ref 3.8–10.6)

## 2015-03-01 LAB — BASIC METABOLIC PANEL
Anion gap: 4 — ABNORMAL LOW (ref 5–15)
BUN: 11 mg/dL (ref 6–20)
CHLORIDE: 109 mmol/L (ref 101–111)
CO2: 29 mmol/L (ref 22–32)
CREATININE: 0.81 mg/dL (ref 0.61–1.24)
Calcium: 8.5 mg/dL — ABNORMAL LOW (ref 8.9–10.3)
Glucose, Bld: 84 mg/dL (ref 65–99)
POTASSIUM: 3.8 mmol/L (ref 3.5–5.1)
SODIUM: 142 mmol/L (ref 135–145)

## 2015-03-01 MED ORDER — POLYETHYLENE GLYCOL 3350 17 G PO PACK: 17.0000 g | PACK | Freq: Every day | ORAL | Status: DC | PRN

## 2015-03-01 MED ORDER — LEVOFLOXACIN IN D5W 500 MG/100ML IV SOLN
500.0000 mg | INTRAVENOUS | Status: AC
  Administered 2015-03-01 – 2015-03-02 (×2): 500 mg via INTRAVENOUS
  Filled 2015-03-01 (×3): qty 100

## 2015-03-01 MED ORDER — SIMVASTATIN 40 MG PO TABS
40.0000 mg | ORAL_TABLET | Freq: Every day | ORAL | Status: DC
  Administered 2015-03-01: 40 mg via ORAL
  Filled 2015-03-01: qty 1

## 2015-03-01 MED ORDER — ENSURE ENLIVE PO LIQD
237.0000 mL | Freq: Two times a day (BID) | ORAL | Status: DC
  Administered 2015-03-01 – 2015-03-02 (×3): 237 mL via ORAL

## 2015-03-01 MED ORDER — OMEGA-3-ACID ETHYL ESTERS 1 G PO CAPS
1.0000 g | ORAL_CAPSULE | Freq: Every day | ORAL | Status: DC
  Administered 2015-03-01 – 2015-03-02 (×2): 1 g via ORAL
  Filled 2015-03-01 (×2): qty 1

## 2015-03-01 MED ORDER — CALCIUM CARBONATE 1250 (500 CA) MG PO TABS: 1.0000 | ORAL_TABLET | Freq: Every day | ORAL | Status: DC

## 2015-03-01 MED ORDER — FERROUS SULFATE 325 (65 FE) MG PO TABS
325.0000 mg | ORAL_TABLET | Freq: Every day | ORAL | Status: DC
  Administered 2015-03-01 – 2015-03-02 (×2): 325 mg via ORAL
  Filled 2015-03-01 (×2): qty 1

## 2015-03-01 MED ORDER — ALPRAZOLAM 0.25 MG PO TABS: 0.2500 mg | ORAL_TABLET | Freq: Two times a day (BID) | ORAL | Status: DC | PRN

## 2015-03-01 MED ORDER — FAMOTIDINE 20 MG PO TABS
20.0000 mg | ORAL_TABLET | Freq: Every day | ORAL | Status: DC
  Administered 2015-03-01 – 2015-03-02 (×2): 20 mg via ORAL
  Filled 2015-03-01 (×2): qty 1

## 2015-03-01 MED ORDER — ISOSORBIDE MONONITRATE ER 30 MG PO TB24
30.0000 mg | ORAL_TABLET | Freq: Every day | ORAL | Status: DC
  Administered 2015-03-01 – 2015-03-02 (×2): 30 mg via ORAL
  Filled 2015-03-01 (×2): qty 1

## 2015-03-01 MED ORDER — PNEUMOCOCCAL VAC POLYVALENT 25 MCG/0.5ML IJ INJ
0.5000 mL | INJECTION | INTRAMUSCULAR | Status: AC
  Administered 2015-03-02: 10:00:00 0.5 mL via INTRAMUSCULAR
  Filled 2015-03-01: qty 0.5

## 2015-03-01 MED ORDER — NITROGLYCERIN 0.4 MG SL SUBL: 0.4000 mg | SUBLINGUAL_TABLET | SUBLINGUAL | Status: DC | PRN

## 2015-03-01 MED ORDER — ALUM & MAG HYDROXIDE-SIMETH 200-200-20 MG/5ML PO SUSP: 30.0000 mL | Freq: Four times a day (QID) | ORAL | Status: DC | PRN

## 2015-03-01 MED ORDER — CALCIUM CARBONATE ANTACID 500 MG PO CHEW
500.0000 mg | CHEWABLE_TABLET | Freq: Every day | ORAL | Status: DC
  Administered 2015-03-01: 200 mg via ORAL
  Administered 2015-03-02: 10:00:00 500 mg via ORAL
  Filled 2015-03-01 (×2): qty 1

## 2015-03-01 MED ORDER — ZOLPIDEM TARTRATE 5 MG PO TABS: 5.0000 mg | ORAL_TABLET | Freq: Every evening | ORAL | Status: DC | PRN

## 2015-03-01 NOTE — Plan of Care (Signed)
Problem: Discharge Progression Outcomes Goal: Discharge plan in place and appropriate Individualization: Pt prefers to be called Charles Rose who lives at home alone.  Hx COPD, Cad, Anxiety, Hyperlipidemia, anemia controlled by home medications  Moderate fall risk. Bed alarm on, hourly rounding. Pt understands how to use call system for assistance.

## 2015-03-01 NOTE — Plan of Care (Addendum)
Problem: Discharge Progression Outcomes Goal: Discharge plan in place and appropriate Individualization:  Outcome: Progressing Individualization of Care Pt prefers to be called Charles Rose Hx of  CAD, HLD, GERD, thrombocytopenia, anemia, anxiety, asthma, esophogeal cancer.  He has chronic COPD and uses 2L O2 at home.    Goal: Other Discharge Outcomes/Goals Pain:  Patient had complaints of pain in back.  Given Oxycodone x1 with pain resolving upon reassessment. Hemodynamically:  Patient is afebrile and VSS this shift.  He was admitted for hemoptysis but has been without blood in sputum this shift. BP 138/74 mmHg  Pulse 67  Temp(Src) 97.8 F (36.6 C) (Oral)  Resp 20  Ht 5\' 9"  (1.753 m)  Wt 155 lb 14.4 oz (70.716 kg)  BMI 23.01 kg/m2  SpO2 60% Complications:  Patient without complications this shift. Diet:  Patient on heart healthy diet.  He was given swallow screen by RN prior to oral medication administration and he passed.  He did not have any food after admittance to floor. Activity:  Patient is up independently.  He is steady on his feet.  Bed alarm is on and stand by assist given during the night for safety.  He tolerated well.

## 2015-03-01 NOTE — Care Management Note (Signed)
Case Management Note  Patient Details  Name: Charles Rose MRN: [id number] Date of Birth: [birth year]  Subjective/Objective:   Mr Kauth reports that he has home oxygen provided by [employer], and that he also has a portable oxygen tank.                  Action/Plan:   Expected Discharge Date:                  Expected Discharge Plan:     In-House Referral:     Discharge planning Services     Post Acute Care Choice:    Choice offered to:     DME Arranged:    DME Agency:     HH Arranged:    [place] Agency:     Status of Service:     Medicare Important Message Given:    Date Medicare IM Given:    Medicare IM give by:    Date Additional Medicare IM Given:    Additional Medicare Important Message give by:     If discussed at [place] of Stay Meetings, dates discussed:    Additional Comments:  Dosha Broshears A, RN 03/01/2015, 4:34 PM

## 2015-03-01 NOTE — Progress Notes (Addendum)
Initial Nutrition Assessment   INTERVENTION:   Meals and Snacks: Cater to patient preferences; Pt asking to be downgraded to Pureed diet order secondary to dysphagia and lack of dentures. MD Caryl Comes agreeable.  Medical Food Supplement Therapy: Agree with Ensure Enlive po BID, each supplement provides 350 kcal and 20 grams of protein   NUTRITION DIAGNOSIS:   Swallowing difficulty related to chronic illness, social / environmental circumstances as evidenced by per patient/family report.  GOAL:   Patient will meet greater than or equal to 90% of their needs  MONITOR:    (Energy Intake, Digestive System, Anthropometrics,Anemia Profile)  REASON FOR ASSESSMENT:   Malnutrition Screening Tool    ASSESSMENT:   Pt admitted with hemoptysis.  Past Medical History  Diagnosis Date  . Coronary artery disease   . COPD (chronic obstructive pulmonary disease)   . Hyperlipidemia   . Thrombocytopenia   . Anemia   . Anxiety   . Asthma   . Cancer     esophagus    Diet Order:  DIET - DYS 1 Room service appropriate?: Yes; Fluid consistency:: Thin    Current Nutrition: Pt ate mashed potatoes and some green beans at lunch but reports difficulty chewing green beans.   Food/Nutrition-Related History: Pt reports eating 3 packets of oatmeal for breakfast, banana sandwiches for lunch and dinner is pureed options usually. Pt reports dysphagia started with esophageal cancer and continues recently. Pt reports drinking Ensure daily at home PTA.   Medications: ferrous sulfate, pepcid, tums  Electrolyte/Renal Profile and Glucose Profile:   Recent Labs Lab 02/28/15 2049 03/01/15 0438  NA 140 142  K 4.3 3.8  CL 109 109  CO2 26 29  BUN 15 11  CREATININE 0.90 0.81  CALCIUM 8.9 8.5*  GLUCOSE 104* 84   Protein Profile:  Recent Labs Lab 02/28/15 2049  ALBUMIN 3.8    Nutritional Anemia Profile:  CBC Latest Ref Rng 03/01/2015 02/28/2015 01/17/2015  WBC 3.8 - 10.6 K/uL 5.3 7.2 5.1   Hemoglobin 13.0 - 18.0 g/dL 8.4(L) 9.5(L) 8.3(L)  Hematocrit 40.0 - 52.0 % 26.9(L) 30.3(L) 26.5(L)  Platelets 150 - 440 K/uL 123(L) 136(L) 104(L)    Gastrointestinal Profile: Last BM:  02/28/2015   Nutrition-Focused Physical Exam Findings: Nutrition-Focused physical exam completed. Findings are WDL for fat depletion, muscle depletion, and edema.    Weight Change: Pt reports weight has been up and down. Pt reports weight of 174lbs March 2016 (11% weight loss in 7 months) Pt reports weighing himself daily PTA weight of 150.8lbs yesterday at home.   Skin:  Reviewed, no issues   Height:   Ht Readings from Last 1 Encounters:  03/01/15 5\' 9"  (1.753 m)    Weight:   Wt Readings from Last 1 Encounters:  03/01/15 155 lb 14.4 oz (70.716 kg)   BMI:  Body mass index is 23.01 kg/(m^2).  Estimated Nutritional Needs:   Kcal:  BEE: 1142kcals, TEE: (IF 1.1-1.3)(AF 1.2) 1903-2250kcals  Protein:  57-71g protein (0.8-1.0g/kg)  Fluid:  1767-2168mL of fluid (25-80mL/kg)  EDUCATION NEEDS:   No education needs identified at this time   [place], RD, LDN Pager [phone]

## 2015-03-01 NOTE — Progress Notes (Signed)
Patient ID: Charles Rose, male   DOB: 07-Feb-1941, 74 y.o.   MRN: [id number] SUBJECTIVE:  Admitted with episodes of hemoptysis and increased dyspnea; has h/o significant COPD.  Underwent CT chest, which reveals bronchiectasis and area of likely pulmonary hemorrhage.  ASA and plavix held; no further hemoptysis.  No recent chest pain, palpitations.  ______________________________________________________________________  ROS: Please see HPI; remainder of complete 10 point ROS is negative   Past Medical History  Diagnosis Date  . Coronary artery disease   . COPD (chronic obstructive pulmonary disease)   . Hyperlipidemia   . Thrombocytopenia   . Anemia   . Anxiety   . Asthma   . Cancer     esophagus    Past Surgical History  Procedure Laterality Date  . Coronary stent placement       Current facility-administered medications:  .  acetaminophen (TYLENOL) tablet 650 mg, 650 mg, Oral, Q6H PRN **OR** acetaminophen (TYLENOL) suppository 650 mg, 650 mg, Rectal, Q6H PRN, Lytle Butte, MD .  ALPRAZolam Duanne Moron) tablet 0.25 mg, 0.25 mg, Oral, BID PRN, Lytle Butte, MD .  alum & mag hydroxide-simeth (MAALOX/MYLANTA) 200-200-20 MG/5ML suspension 30 mL, 30 mL, Oral, Q6H PRN, Lytle Butte, MD .  calcium carbonate (TUMS - dosed in mg elemental calcium) chewable tablet 500 mg of elemental calcium, 500 mg of elemental calcium, Oral, Q breakfast, Lytle Butte, MD, 200 mg of elemental calcium at 03/01/15 0855 .  famotidine (PEPCID) tablet 20 mg, 20 mg, Oral, Daily, Lytle Butte, MD, 20 mg at 03/01/15 0855 .  feeding supplement (ENSURE ENLIVE) (ENSURE ENLIVE) liquid 237 mL, 237 mL, Oral, BID BM, Lytle Butte, MD, 237 mL at 03/01/15 0856 .  ferrous sulfate tablet 325 mg, 325 mg, Oral, Q breakfast, Lytle Butte, MD, 325 mg at 03/01/15 0855 .  guaiFENesin-codeine 100-10 MG/5ML solution 10 mL, 10 mL, Oral, Q4H PRN, Lytle Butte, MD .  ipratropium-albuterol (DUONEB) 0.5-2.5 (3) MG/3ML nebulizer  solution 3 mL, 3 mL, Nebulization, Q4H PRN, Lytle Butte, MD, 3 mL at 03/01/15 0912 .  isosorbide mononitrate (IMDUR) 24 hr tablet 30 mg, 30 mg, Oral, Daily, Lytle Butte, MD, 30 mg at 03/01/15 0855 .  levofloxacin (LEVAQUIN) IVPB 500 mg, 500 mg, Intravenous, Q24H, Tama High III, MD .  morphine 2 MG/ML injection 2 mg, 2 mg, Intravenous, Q4H PRN, Lytle Butte, MD .  nitroGLYCERIN (NITROSTAT) SL tablet 0.4 mg, 0.4 mg, Sublingual, Q5 min PRN, Lytle Butte, MD .  omega-3 acid ethyl esters (LOVAZA) capsule 1 g, 1 g, Oral, Daily, Lytle Butte, MD, 1 g at 03/01/15 0855 .  ondansetron (ZOFRAN) tablet 4 mg, 4 mg, Oral, Q6H PRN **OR** ondansetron (ZOFRAN) injection 4 mg, 4 mg, Intravenous, Q6H PRN, Lytle Butte, MD .  oxyCODONE (Oxy IR/ROXICODONE) immediate release tablet 5 mg, 5 mg, Oral, Q4H PRN, Lytle Butte, MD, 5 mg at 03/01/15 0032 .  [START ON 03/02/2015] pneumococcal 23 valent vaccine (PNU-IMMUNE) injection 0.5 mL, 0.5 mL, Intramuscular, Tomorrow-1000, Vishwanath Hande, MD .  polyethylene glycol (MIRALAX / GLYCOLAX) packet 17 g, 17 g, Oral, Daily PRN, Lytle Butte, MD .  simvastatin (ZOCOR) tablet 40 mg, 40 mg, Oral, q1800, Lytle Butte, MD .  zolpidem (AMBIEN) tablet 5 mg, 5 mg, Oral, QHS PRN, Lytle Butte, MD  PHYSICAL EXAM:  BP 128/80 mmHg  Pulse 71  Temp(Src) 98 F (36.7 C) (Oral)  Resp 18  Ht 5\' 9"  (  1.753 m)  Wt 70.716 kg (155 lb 14.4 oz)  BMI 23.01 kg/m2  SpO2 95%  General: elderly  male, in NAD HEENT: PERRL; OP moist without lesions. Neck: supple, trachea midline, no thyromegaly Chest: normal to palpation Lungs: crackles bilaterally without retractions or wheezes Cardiovascular: RRR, distant; distal pulses 2+ Abdomen: soft, nontender, nondistended, positive bowel sounds Extremities: no clubbing, cyanosis, edema Neuro: alert and oriented, moves all extremities Derm: no significant rashes or nodules; good skin turgor Lymph: no cervical or supraclavicular  lymphadenopathy  Labs and imaging studies were reviewed  ASSESSMENT/PLAN:   1. Hemoptysis due to bronchiectasis with pulmonary hemorrhage- holding ASA, plavix for now, with risks discussed with pt.  Empiric abx. Pulmonary to see.  Follow exam and sats.  Add SVNs 2.  CAD/PVD.  Cont statin therapy; follow bp and HR. 3. Anemia- combination of beta thal minor and likely chronic blood loss; following  Follow overnight for any worsening; await any recs from pulmonary.

## 2015-03-01 NOTE — Consult Note (Signed)
Pulmonary Critical Care  Initial Consult Note   Charles Rose GYJ:[id number] DOB: Mar 28, 1941 DOA: 02/28/2015  Referring physician: Ramonita Lab, MD PCP: Tracie Harrier, MD   Chief Complaint: Coughing Up blood  HPI: Charles Rose is a 74 y.o. male with prior history of COPD CAD esophageal cancer presented to the ED with coughing up blood. The patient states that he was coughing up moderate amounts of blood and now he states that it has improved. The last time he coughed he states he did not bring anything up. Patient states eh has no fevers noted. He did not have any chest pains noted. He has no dizziness noted. He had a CT scan done adn this shows no PE and cystic bronchiectasis with air fluid levels in the RLL. The concern is of course if this is the source of his bleeding. He is currently on antibiotics and as noted there has been some improvement. He will need close observation to resolution. If there is an increase in hemoptysis then would consider a bronchoscopy or transfer to a tertiary care center.   Review of Systems:  Constitutional:  No weight loss, night sweats, Fevers, chills, +fatigue.  HEENT:  No headaches, nasal congestion, post nasal drip,  Cardio-vascular:  No chest pain, Orthopnea, PND, swelling in lower extremities  GI:  No heartburn, indigestion, abdominal pain, nausea, vomiting Resp:  +shortness of breath with exertion +coughing up of blood(improving) Skin:  no rash or lesions.  Musculoskeletal:  No joint pain or swelling.   Remainder ROS performed and is unremarkable other than noted in HPI  Past Medical History  Diagnosis Date  . Coronary artery disease   . COPD (chronic obstructive pulmonary disease)   . Hyperlipidemia   . Thrombocytopenia   . Anemia   . Anxiety   . Asthma   . Cancer     esophagus   Past Surgical History  Procedure Laterality Date  . Coronary stent placement     Social History:  reports that he has quit smoking. He does  not have any smokeless tobacco history on file. He reports that he does not drink alcohol. His drug history is not on file.  No Known Allergies  Family History  Problem Relation Age of Onset  . Diabetes Other     Prior to Admission medications   Medication Sig Start Date End Date Taking? Authorizing Provider  albuterol (PROVENTIL HFA;VENTOLIN HFA) 108 (90 BASE) MCG/ACT inhaler Inhale 2 puffs into the lungs 2 (two) times daily.    Yes Historical Provider, MD  ALPRAZolam (XANAX) 0.25 MG tablet Take 0.25 mg by mouth 2 (two) times daily as needed for anxiety.   Yes Historical Provider, MD  aspirin EC 81 MG tablet Take 81 mg by mouth daily.   Yes Historical Provider, MD  calcium carbonate (OS-CAL - DOSED IN MG OF ELEMENTAL CALCIUM) 1250 (500 CA) MG tablet Take 1 tablet (500 mg of elemental calcium total) by mouth daily with breakfast. 01/17/15  Yes Vishwanath Hande, MD  clopidogrel (PLAVIX) 75 MG tablet Take 75 mg by mouth daily.   Yes Historical Provider, MD  dextromethorphan-guaiFENesin (ROBITUSSIN-DM) 10-100 MG/5ML liquid Take 10 mLs by mouth 2 (two) times daily.   Yes Historical Provider, MD  feeding supplement, ENSURE ENLIVE, (ENSURE ENLIVE) LIQD Take 237 mLs by mouth 2 (two) times daily between meals. 01/17/15  Yes Vishwanath Hande, MD  ferrous sulfate 325 (65 FE) MG tablet Take 325 mg by mouth daily with breakfast.   Yes Historical Provider,  MD  HYDROcodone-acetaminophen (NORCO/VICODIN) 5-325 MG per tablet Take 1 tablet by mouth 2 (two) times daily.   Yes Historical Provider, MD  ipratropium-albuterol (DUONEB) 0.5-2.5 (3) MG/3ML SOLN Take 3 mLs by nebulization 3 (three) times daily.   Yes Historical Provider, MD  isosorbide mononitrate (IMDUR) 30 MG 24 hr tablet Take 30 mg by mouth daily.   Yes Historical Provider, MD  omega-3 acid ethyl esters (LOVAZA) 1 G capsule Take 1 g by mouth daily.   Yes Historical Provider, MD  ranitidine (ZANTAC) 150 MG tablet Take 150 mg by mouth 2 (two) times  daily.   Yes Historical Provider, MD  simvastatin (ZOCOR) 40 MG tablet Take 40 mg by mouth daily at 6 PM.   Yes Historical Provider, MD  nitroGLYCERIN (NITROSTAT) 0.4 MG SL tablet Place 0.4 mg under the tongue every 5 (five) minutes as needed for chest pain.    Historical Provider, MD   Physical Exam: Filed Vitals:   03/01/15 0532 03/01/15 0855 03/01/15 0912 03/01/15 1337  BP: 117/68 128/80  119/73  Pulse: 73 71  75  Temp: 98.6 F (37 C) 98 F (36.7 C)  97.7 F (36.5 C)  TempSrc: Oral Oral  Oral  Resp: 18 18  20   Height:      Weight:      SpO2: 94% 96% 95% 96%    Wt Readings from Last 3 Encounters:  03/01/15 70.716 kg (155 lb 14.4 oz)  01/13/15 69.264 kg (152 lb 11.2 oz)  01/01/15 68.04 kg (150 lb)    General:  Appears calm and comfortable Eyes: PERRL, normal lids, irises & conjunctiva ENT: grossly normal hearing, lips & tongue Neck: no LAD, masses or thyromegaly Cardiovascular: RRR, no m/r/g. No LE edema. Respiratory: overall diminished with few ronchi. Normal respiratory effort. Abdomen: soft, nontender Skin: no rash or induration seen on limited exam Musculoskeletal: grossly normal tone BUE/BLE Psychiatric: grossly normal mood and affect Neurologic: grossly non-focal.          Labs on Admission:  Basic Metabolic Panel:  Recent Labs Lab 02/28/15 2049 03/01/15 0438  NA 140 142  K 4.3 3.8  CL 109 109  CO2 26 29  GLUCOSE 104* 84  BUN 15 11  CREATININE 0.90 0.81  CALCIUM 8.9 8.5*   Liver Function Tests:  Recent Labs Lab 02/28/15 2049  AST 23  ALT 15*  ALKPHOS 89  BILITOT 0.9  PROT 6.2*  ALBUMIN 3.8   No results for input(s): LIPASE, AMYLASE in the last 168 hours. No results for input(s): AMMONIA in the last 168 hours. CBC:  Recent Labs Lab 02/28/15 2049 03/01/15 0438  WBC 7.2 5.3  NEUTROABS 5.3  --   HGB 9.5* 8.4*  HCT 30.3* 26.9*  MCV 66.9* 67.0*  PLT 136* 123*   Cardiac Enzymes: No results for input(s): CKTOTAL, CKMB, CKMBINDEX,  TROPONINI in the last 168 hours.  BNP (last 3 results)  Recent Labs  01/12/15 2021  BNP 26.0    ProBNP (last 3 results) No results for input(s): PROBNP in the last 8760 hours.  CBG: No results for input(s): GLUCAP in the last 168 hours.  Radiological Exams on Admission: Dg Chest 2 View  02/28/2015   CLINICAL DATA:  Hemoptysis.  EXAM: CHEST  2 VIEW  COMPARISON:  Chest radiograph of January 12, 2015. MRI scan of January 14, 2015.  FINDINGS: The heart size and mediastinal contours are within normal limits. No pneumothorax or pleural effusion is noted. Stable scarring is noted in both  lung bases. There is the interval development of a small air-fluid level in the medial portion of the right lung base. Old compression fracture of mid thoracic vertebral body is noted.  IMPRESSION: Interval development of small air-fluid level seen medially in right lung base. Small abscess cannot be excluded. CT scan of the chest is recommended for further evaluation.   Electronically Signed   By: Marijo Conception, M.D.   On: 02/28/2015 21:06   Ct Angio Chest Pe W/cm &/or Wo Cm  02/28/2015   CLINICAL DATA:  Coughing up blood twice today. History of COPD. Pneumonia last March.  EXAM: CT ANGIOGRAPHY CHEST WITH CONTRAST  TECHNIQUE: Multidetector CT imaging of the chest was performed using the standard protocol during bolus administration of intravenous contrast. Multiplanar CT image reconstructions and MIPs were obtained to evaluate the vascular anatomy.  CONTRAST:  158mL OMNIPAQUE IOHEXOL 350 MG/ML SOLN  COMPARISON:  01/01/2015  FINDINGS: Technically adequate study with good opacification of the central and segmental pulmonary arteries. No focal filling defects demonstrated. No evidence of significant pulmonary embolus.  Normal heart size. Normal caliber thoracic aorta. No aortic dissection. Scattered calcifications in the aorta. Coronary artery calcifications. Esophagus is decompressed with mild thickening of the wall of the  distal esophagus, nonspecific. Scattered lymph nodes in the mediastinum are not pathologically enlarged.  Diffuse emphysema throughout the lungs. Scarring and fibrosis throughout the lungs. Bronchiectasis most prominent in the lung bases with peribronchial thickening consistent with chronic bronchitis. Focal cystic bronchiectasis in the right lung base with air-fluid level. Atelectasis in the lung bases. No focal consolidation. No pneumothorax. No pleural effusions.  Included portions of the upper abdominal organs are grossly unremarkable. Compression fractures at T11 and L1 appears stable since prior study. Compression fracture at T7 with about 40% loss of height is progressing since previous study. There is sclerosis in the T7 and T11 vertebrae. Changes are likely due to osteoporosis but metastasis is not entirely excluded. Correlate with clinical history.  Review of the MIP images confirms the above findings.  IMPRESSION: No evidence of significant pulmonary embolus. Diffuse emphysematous changes, fibrosis, and chronic bronchitic changes in the lungs with bronchiectasis. Cystic area of bronchiectasis in the right lower lung with air-fluid level. This may represent source of hemorrhage. No focal consolidation in the lungs. Unchanged appearance of compression fractures at T11 and L1. Progression of compression fracture at T7 with increased sclerosis. Findings may be due to osteoporosis but metastatic disease not excluded.   Electronically Signed   By: Lucienne Capers M.D.   On: 02/28/2015 22:43      EKG: Independently reviewed.  Assessment/Plan Principal Problem:   Hemoptysis Active Problems:   COPD with emphysema   Coronary artery disease   Anemia due to chronic blood loss   1. Hemoptysis -has an area of abnormality on the CT scan could be a cystic bronchiectatic area filled with fluid or blood. This will need to be monitored to resolution. Alternatively this could represent an infectious fluid  collection or less likely neoplastic -would repeat CT scan after treatment with antibiotics to demonstrate resolution -currently his hemoptysis has improved but if it recurs consider a bronchoscopy or transfer to a tertiary care if there is massive bleeding -would consider stopping plavix and also ASA if possible -repeat H/H in am  2. COPD -continue treatment as you are     I have personally obtained a history, examined the patient, evaluated laboratory and imaging results, formulated the assessment and plan and placed  orders.  The Patient requires high complexity decision making for assessment and support.    Allyne Gee, MD [facility] Pulmonary Critical Care Medicine Sleep Medicine

## 2015-03-02 DIAGNOSIS — R042 Hemoptysis: Secondary | ICD-10-CM | POA: Diagnosis not present

## 2015-03-02 LAB — CBC
HCT: 27.4 % — ABNORMAL LOW (ref 40.0–52.0)
Hemoglobin: 8.7 g/dL — ABNORMAL LOW (ref 13.0–18.0)
MCH: 21.2 pg — AB (ref 26.0–34.0)
MCHC: 31.6 g/dL — ABNORMAL LOW (ref 32.0–36.0)
MCV: 67.1 fL — AB (ref 80.0–100.0)
PLATELETS: 122 10*3/uL — AB (ref 150–440)
RBC: 4.08 MIL/uL — AB (ref 4.40–5.90)
RDW: 16.1 % — AB (ref 11.5–14.5)
WBC: 6.2 10*3/uL (ref 3.8–10.6)

## 2015-03-02 MED ORDER — LEVOFLOXACIN 500 MG PO TABS: 500.0000 mg | ORAL_TABLET | Freq: Every day | ORAL | Status: DC

## 2015-03-02 NOTE — Progress Notes (Signed)
Patient discharged home per MD order. VSS. IV removed and in tact. Prescription given to patient. All discharge instructions given and all questions answered.

## 2015-03-02 NOTE — Plan of Care (Signed)
Problem: Discharge Progression Outcomes Goal: Discharge plan in place and appropriate Individualization:  Outcome: Progressing Individualization of Care Pt prefers to be called Charles Rose Hx of CAD, HLD, GERD, thrombocytopenia, anemia, anxiety, asthma, esophogeal cancer. He has chronic COPD and uses 2L O2 at home.   Goal: Other Discharge Outcomes/Goals Pain: Patient without complaints of pain this shift.  Hemodynamically: Patient is afebrile and VSS this shift. He was admitted for hemoptysis but has been without blood in sputum this shift. BP 105/63 mmHg  Pulse 69  Temp(Src) 98.3 F (36.8 C) (Oral)  Resp 20  Ht 5\' 9"  (1.753 m)  Wt 155 lb 14.4 oz (70.716 kg)  BMI 23.01 kg/m2  SpO2 55% Complications: He did complain of cough and was given Robitussin per eMAR with effective results.  He later had complaints of SOB.  Respiratory called for PRN respiratory treatment.  Patient report relief upon reassessment. Diet: Patient on heart healthy diet. Activity: Patient is up independently. He is steady on his feet. Bed alarm is on and stand by assist given during the night for safety. He tolerated well.

## 2015-03-02 NOTE — Discharge Instructions (Signed)
Bronchiectasis Bronchiectasis is a condition in which the airways (bronchi) are damaged and widened. This makes it difficult for the lungs to get rid of mucus. As a result, mucus gathers in the airways, and this often leads to lung infections. Infection can cause inflammation in the airways, which may further weaken and damage the bronchi.  CAUSES  Bronchiectasis may be present at birth (congenital) or may develop later in life. Sometimes there is no apparent cause. Some common causes include:  Cystic fibrosis.   Recurrent lung infections (such as pneumonia, tuberculosis, or fungal infections).  Foreign bodies or other blockages in the lungs.  Breathing in fluid, food, or other foreign objects (aspiration). SIGNS AND SYMPTOMS  Common symptoms include:  A daily cough that brings up mucus and lasts for more than 3 weeks.  Frequent lung infections (such as pneumonia, tuberculosis, or fungal infections).  Shortness of breath and wheezing.   Weakness and fatigue. DIAGNOSIS  Various tests may be done to help diagnose bronchiectasis. Tests may include:  Chest X-rays or CT scans.   Breathing tests to help determine how your lungs are working.   Sputum cultures to check for infection.   Blood tests and other tests to check for related diseases or causes, such as cystic fibrosis. TREATMENT  Treatment varies depending on the severity of the condition. Medicines may be given to loosen the mucus to be coughed up (expectorants), to relax the muscles of the air passages (bronchodilators), or to prevent or treat infections (antibiotics). Physical therapy methods may be recommended to help clear mucus from the lungs. For severe cases, surgery may be done to remove the affected part of the lung. HOME CARE INSTRUCTIONS   Get plenty of rest.   Only take over-the-counter or prescription medicines as directed by your health care provider. If antibiotic medicines were prescribed, take them as  directed. Finish them even if you start to feel better.  Avoid sedatives and antihistamines unless otherwise directed by your health care provider. These medicines tend to thicken the mucus in the lungs.   Perform any breathing exercises or techniques to clear the lungs as directed by your health care provider.  Drink enough fluids to keep your urine clear or pale yellow.  Consider using a cold steam vaporizer or humidifier in your room or home to help loosen secretions.   If the cough is worse at night, try sleeping in a semi-upright position in a recliner or using a couple of pillows.   Avoid cigarette smoke and lung irritants. If you smoke, quit.  Stay inside when pollution and ozone levels are high.   Stay current with vaccinations and immunizations.   Follow up with your health care provider as directed.  SEEK MEDICAL CARE IF:  You cough up more thick, discolored mucus (sputum) that is yellow to green in color.  You have a fever or persistent symptoms for more than 2-3 days.  You cannot control your cough and are losing sleep. SEEK IMMEDIATE MEDICAL CARE IF:   You cough up blood.   You have chest pain or increasing shortness of breath.   You have pain that is getting worse or is uncontrolled with medicines.   You have a fever and your symptoms suddenly get worse. MAKE SURE YOU:  Understand these instructions.   Will watch your condition.   Will get help right away if you are not doing well or get worse.  Document Released: 04/04/2007 Document Revised: 06/12/2013 Document Reviewed: 12/13/2012 [employer] Patient Information   2015 [employer]. This information is not intended to replace advice given to you by your health care provider. Make sure you discuss any questions you have with your health care provider.

## 2015-03-02 NOTE — Discharge Summary (Signed)
Physician Discharge Summary  Patient ID: Charles Rose MRN: [id number] DOB/AGE: Feb 26, 1941 74 y.o.  Admit date: 02/28/2015 Discharge date: 03/02/2015  Admission Diagnoses:  Discharge Diagnoses:  Principal Problem:   Hemoptysis Active Problems:   COPD with emphysema   Coronary artery disease   Anemia due to chronic blood loss   Discharged Condition: stable  Hospital Course: Admitted after having hemoptysis; CT revealed bronchiectasis with air-fluid level, which was felt to be source of bleed.  Placed on abx; aspirin and plavix were held, and no further bleeding noted.  Evaluated by pulmonology, who recommended continuing current therapy, with repeat CT scan after finishing abx course.   Discussed risk of being off ASA and plavix at length with pt.  He understands increased risk of clotting event/vascular disease while off these, but understands it is necessary to prevent further bleeding.  Pt wishes to go home; discussed need to return for any further bleeding/hemoptysis, or any cardiac symptoms, and he is comfortable with this. He will follow low salt diet; up as tolerated. Cont on oxygen 2L [place] continuously.  He will follow up with Dr. Ginette Rose next week, and pulmonary and CT f/u can be arranged from there.  Consults: pulmonary/intensive care   Discharge Exam: Blood pressure 113/66, pulse 77, temperature 98.1 F (36.7 C), temperature source Oral, resp. rate 18, height 5\' 9"  (1.753 m), weight 70.716 kg (155 lb 14.4 oz), SpO2 95 %.   Disposition:  Home    Medication List    STOP taking these medications        aspirin EC 81 MG tablet     calcium carbonate 1250 (500 CA) MG tablet  Commonly known as:  OS-CAL - dosed in mg of elemental calcium     clopidogrel 75 MG tablet  Commonly known as:  PLAVIX     dextromethorphan-guaiFENesin 10-100 MG/5ML liquid  Commonly known as:  ROBITUSSIN-DM     omega-3 acid ethyl esters 1 G capsule  Commonly known as:  LOVAZA      TAKE  these medications        albuterol 108 (90 BASE) MCG/ACT inhaler  Commonly known as:  PROVENTIL HFA;VENTOLIN HFA  Inhale 2 puffs into the lungs 2 (two) times daily.     ALPRAZolam 0.25 MG tablet  Commonly known as:  XANAX  Take 0.25 mg by mouth 2 (two) times daily as needed for anxiety.     feeding supplement (ENSURE ENLIVE) Liqd  Take 237 mLs by mouth 2 (two) times daily between meals.     ferrous sulfate 325 (65 FE) MG tablet  Take 325 mg by mouth daily with breakfast.     HYDROcodone-acetaminophen 5-325 MG per tablet  Commonly known as:  NORCO/VICODIN  Take 1 tablet by mouth 2 (two) times daily.     ipratropium-albuterol 0.5-2.5 (3) MG/3ML Soln  Commonly known as:  DUONEB  Take 3 mLs by nebulization 3 (three) times daily.     isosorbide mononitrate 30 MG 24 hr tablet  Commonly known as:  IMDUR  Take 30 mg by mouth daily.     levofloxacin 500 MG tablet  Commonly known as:  LEVAQUIN  Take 1 tablet (500 mg total) by mouth daily. X 10 days     nitroGLYCERIN 0.4 MG SL tablet  Commonly known as:  NITROSTAT  Place 0.4 mg under the tongue every 5 (five) minutes as needed for chest pain.     ranitidine 150 MG tablet  Commonly known as:  ZANTAC  Take  150 mg by mouth 2 (two) times daily.     simvastatin 40 MG tablet  Commonly known as:  ZOCOR  Take 40 mg by mouth daily at 6 PM.       Follow-up Information    Follow up with [facility], [place] In 3 days.   Specialty:  Internal Medicine   Contact information:   [place] [address] [phone]       Signed: Tama [place] Rose 03/02/2015, 10:34 AM

## 2015-03-04 LAB — GLOMERULAR BASEMENT MEMBRANE ANTIBODIES: GBM Ab: 3 units (ref 0–20)

## 2015-03-04 LAB — ANCA TITERS
Atypical P-ANCA titer: <1:20 {titer}
C-ANCA: <1:20 {titer}
P-ANCA: <1:20 {titer}

## 2015-03-05 LAB — CULTURE, BLOOD (ROUTINE X 2)
Culture: NO GROWTH
Culture: NO GROWTH

## 2015-03-07 ENCOUNTER — Other Ambulatory Visit: Payer: Self-pay | Admitting: Internal Medicine

## 2015-03-07 DIAGNOSIS — J470 Bronchiectasis with acute lower respiratory infection: Secondary | ICD-10-CM

## 2015-03-20 ENCOUNTER — Ambulatory Visit: Payer: Medicare Other

## 2015-03-21 ENCOUNTER — Ambulatory Visit
Admission: RE | Admit: 2015-03-21 | Discharge: 2015-03-21 | Disposition: A | Discharge status: Home / Self Care | Payer: Medicare Other | Source: Ambulatory Visit | Attending: Internal Medicine | Admitting: Internal Medicine

## 2015-03-21 ENCOUNTER — Inpatient Hospital Stay: Admit: 2015-03-21 | Payer: Self-pay

## 2015-03-21 DIAGNOSIS — I700 Atherosclerosis of aorta: Secondary | ICD-10-CM | POA: Insufficient documentation

## 2015-03-21 DIAGNOSIS — J430 Unilateral pulmonary emphysema [MacLeod's syndrome]: Secondary | ICD-10-CM | POA: Diagnosis not present

## 2015-03-21 DIAGNOSIS — J470 Bronchiectasis with acute lower respiratory infection: Secondary | ICD-10-CM | POA: Diagnosis present

## 2015-03-21 DIAGNOSIS — M4854XA Collapsed vertebra, not elsewhere classified, thoracic region, initial encounter for fracture: Secondary | ICD-10-CM | POA: Insufficient documentation

## 2015-03-21 DIAGNOSIS — I2510 Atherosclerotic heart disease of native coronary artery without angina pectoris: Secondary | ICD-10-CM | POA: Diagnosis not present

## 2015-03-21 MED ORDER — IOHEXOL 300 MG/ML  SOLN
75.0000 mL | Freq: Once | INTRAMUSCULAR | Status: AC | PRN
  Administered 2015-03-21: 75 mL via INTRAVENOUS

## 2015-03-26 ENCOUNTER — Other Ambulatory Visit: Payer: Medicare Other

## 2015-04-17 ENCOUNTER — Encounter: Payer: Self-pay | Admitting: Cardiothoracic Surgery

## 2015-04-17 ENCOUNTER — Ambulatory Visit: Payer: Medicare Other | Admitting: Cardiothoracic Surgery

## 2015-04-17 ENCOUNTER — Inpatient Hospital Stay: Payer: Medicare Other | Attending: Cardiothoracic Surgery | Admitting: Cardiothoracic Surgery

## 2015-04-17 VITALS — BP 118/76 | HR 82 | Temp 97.9°F | Ht 69.0 in | Wt 153.1 lb

## 2015-04-17 DIAGNOSIS — Z79899 Other long term (current) drug therapy: Secondary | ICD-10-CM | POA: Diagnosis not present

## 2015-04-17 DIAGNOSIS — J984 Other disorders of lung: Secondary | ICD-10-CM

## 2015-04-17 DIAGNOSIS — D696 Thrombocytopenia, unspecified: Secondary | ICD-10-CM | POA: Diagnosis not present

## 2015-04-17 DIAGNOSIS — F419 Anxiety disorder, unspecified: Secondary | ICD-10-CM | POA: Insufficient documentation

## 2015-04-17 DIAGNOSIS — J449 Chronic obstructive pulmonary disease, unspecified: Secondary | ICD-10-CM | POA: Insufficient documentation

## 2015-04-17 DIAGNOSIS — I2510 Atherosclerotic heart disease of native coronary artery without angina pectoris: Secondary | ICD-10-CM | POA: Insufficient documentation

## 2015-04-17 DIAGNOSIS — E785 Hyperlipidemia, unspecified: Secondary | ICD-10-CM | POA: Insufficient documentation

## 2015-04-17 DIAGNOSIS — R918 Other nonspecific abnormal finding of lung field: Secondary | ICD-10-CM | POA: Diagnosis present

## 2015-04-17 DIAGNOSIS — R042 Hemoptysis: Secondary | ICD-10-CM | POA: Diagnosis not present

## 2015-04-17 DIAGNOSIS — Z87891 Personal history of nicotine dependence: Secondary | ICD-10-CM | POA: Insufficient documentation

## 2015-04-17 DIAGNOSIS — D649 Anemia, unspecified: Secondary | ICD-10-CM | POA: Diagnosis not present

## 2015-04-17 NOTE — Progress Notes (Signed)
Patient ID: Charles Rose, male   DOB: October 12, 1940, 74 y.o.   MRN: [id number]  Chief Complaint  Patient presents with  . Lung Mass    initial visit CT results    Referred By Dr. Braulio Conte D Reason for Referral hemoptysis with right lower lobe cyst  HPI Location, Quality, Duration, Severity, Timing, Context, Modifying Factors, Associated Signs and Symptoms.  Charles Rose is a 74 y.o. male.  This gentleman is a 74 year old white male with a prior history of extensive tobacco use who quit smoking several years ago. His problems began when he was admitted to the hospital for an episode of hemoptysis after beginning aspirin and Plavix for a right-sided carotid artery stent. This was performed by Dr. Belenda Cruise sneer and after the procedure the patient was begun on anticoagulants but his episode of hemoptysis he was admitted to the hospital where a CT scan was performed. This revealed a stable cyst area in the right lower lobe. The patient stopped his aspirin and Plavix and has had no further hemoptysis. He states he feels pretty well overall. He does use oxygen at home at 2 L. His oxygen saturations are in the low 90s at best. He states that with any exertion he does get somewhat short of breath.   Past Medical History  Diagnosis Date  . Coronary artery disease   . COPD (chronic obstructive pulmonary disease) ([place])   . Hyperlipidemia   . Thrombocytopenia ([place])   . Anemia   . Anxiety   . Asthma   . Cancer (HCC)     esophagus  . Thalassemia     Past Surgical History  Procedure Laterality Date  . Coronary stent placement      Family History  Problem Relation Age of Onset  . Diabetes Other     Social History Social History  Substance Use Topics  . Smoking status: Former Research scientist (life sciences)  . Smokeless tobacco: Not on file  . Alcohol Use: No    No Known Allergies  Current Outpatient Prescriptions  Medication Sig Dispense Refill  . albuterol (PROVENTIL HFA;VENTOLIN HFA) 108 (90 BASE)  MCG/ACT inhaler Inhale 2 puffs into the lungs 2 (two) times daily.     Marland Kitchen ALPRAZolam (XANAX) 0.25 MG tablet Take 0.25 mg by mouth 2 (two) times daily as needed for anxiety.    . feeding supplement, ENSURE ENLIVE, (ENSURE ENLIVE) LIQD Take 237 mLs by mouth 2 (two) times daily between meals. 237 mL 12  . ferrous sulfate 325 (65 FE) MG tablet Take 325 mg by mouth daily with breakfast.    . HYDROcodone-acetaminophen (NORCO/VICODIN) 5-325 MG per tablet Take 1 tablet by mouth 2 (two) times daily.    Marland Kitchen ipratropium-albuterol (DUONEB) 0.5-2.5 (3) MG/3ML SOLN Take 3 mLs by nebulization 3 (three) times daily.    . isosorbide mononitrate (IMDUR) 30 MG 24 hr tablet Take 30 mg by mouth daily.    . nitroGLYCERIN (NITROSTAT) 0.4 MG SL tablet Place 0.4 mg under the tongue every 5 (five) minutes as needed for chest pain.    . ranitidine (ZANTAC) 150 MG tablet Take 150 mg by mouth 2 (two) times daily.    . simvastatin (ZOCOR) 40 MG tablet Take 40 mg by mouth daily at 6 PM.     No current facility-administered medications for this visit.      Review of Systems A complete review of systems was asked and was negative except for the following positive findings vision difficulty, shortness of breath, swelling of legs,  cough, hemoptysis now resolved, shortness of breath, wheezing, trouble swallowing, rash, localize weakness, anxiety, easy bruising.  Blood pressure 118/76, pulse 82, temperature 97.9 F (36.6 C), temperature source Tympanic, height 5\' 9"  (1.753 m), weight 153 lb 1.8 oz (69.45 kg), SpO2 90 %.  Physical Exam CONSTITUTIONAL:  Pleasant, well-developed, well-nourished, and in no acute distress. EYES: Pupils equal and reactive to light, Sclera non-icteric EARS, NOSE, MOUTH AND THROAT:  The oropharynx was clear.  Dentition is poor repair.  Oral mucosa pink and moist. LYMPH NODES:  Lymph nodes in the neck and axillae were normal RESPIRATORY:  Lungs were clear.  Normal respiratory effort without pathologic  use of accessory muscles of respiration CARDIOVASCULAR: Heart was regular without murmurs.  There were no carotid bruits. GI: The abdomen was soft, nontender, and nondistended. There were no palpable masses. There was no hepatosplenomegaly. There were normal bowel sounds in all quadrants. GU:  Rectal deferred.   MUSCULOSKELETAL:  Normal muscle strength and tone.  No clubbing or cyanosis.   SKIN:  There were no pathologic skin lesions.  There were no nodules on palpation. NEUROLOGIC:  Sensation is normal.  Cranial nerves are grossly intact. PSYCH:  Oriented to person, place and time.  Mood and affect are normal.  Data Reviewed Ct of chest  I have personally reviewed the patient's imaging, laboratory findings and medical records.    Assessment    There is an irregular airspace opacification within the right lower lobe. There are no concerning features for a malignancy. I see no other cause for his hemoptysis.    Plan    I have recommended to the patient that he undergo a repeat CT in 6 months. The patient like to have that done through the current nodal clinic and did not wish to come back here for that follow-up. I believe that that would be very appropriate. I explained to him that at the current time I did not see anything that was worrisome for a malignancy.     Nestor Lewandowsky, MD 04/17/2015, 2:02 PM

## 2015-04-17 NOTE — Progress Notes (Signed)
Patient here for initial visit. °

## 2016-04-21 IMAGING — MR MR CERVICAL SPINE WO/W CM
8 series · 45 of 48 positions shown · IV contrast (multihance)
Comparison: CT chest 01/01/2015.  CTA neck 06/26/2014.

CLINICAL DATA: Neck pain and upper back pain. Coughing. History of
esophageal cancer.

EXAM:
MRI CERVICAL SPINE WITHOUT AND WITH CONTRAST; MRI THORACIC SPINE
WITHOUT AND WITH CONTRAST
TECHNIQUE: Multiplanar and multiecho pulse sequences of the cervical spine, to
include the craniocervical junction and cervicothoracic junction,
were obtained according to standard protocol without and with
intravenous contrast.; Multiplanar and multiecho pulse sequences of
the thoracic spine were obtained without and with intravenous
contrast.
CONTRAST:  14mL MULTIHANCE GADOBENATE DIMEGLUMINE 529 MG/ML IV SOLN

[Series 100: T2 · sagittal · 3.0mm · 0.70mm/px · 4 of 15 slices shown (1 of 2)]
[im 1/15]
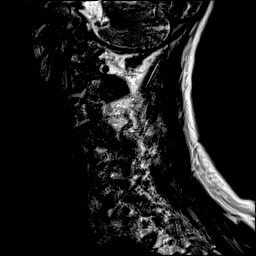
[im 5/15]
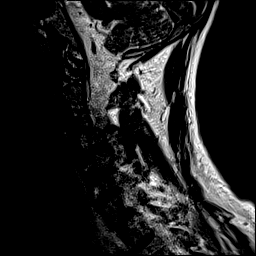
[im 10/15]
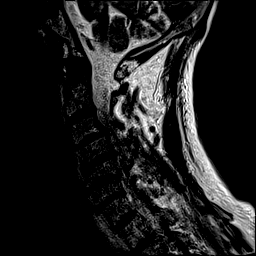
[im 15/15]
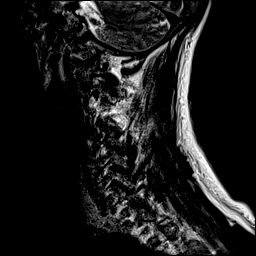

[Series 102: T1 · sagittal · 3.0mm · 0.70mm/px · 4 of 15 slices shown (1 of 2)]
[im 1/15]
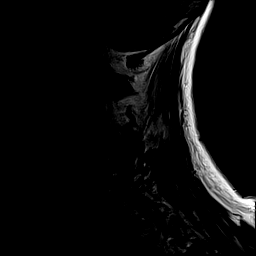
[im 5/15]
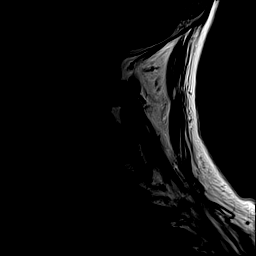
[im 10/15]
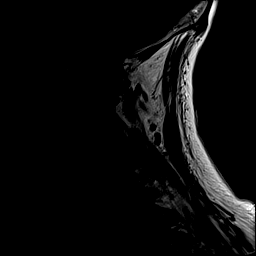
[im 15/15]
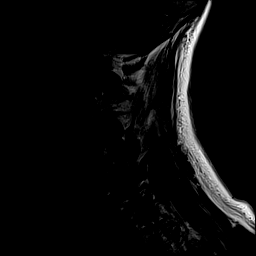

[Series 103: STIR · sagittal · 3.0mm · 0.70mm/px · 4 of 15 slices shown]
[im 1/15]
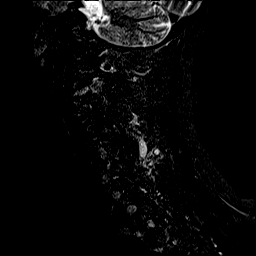
[im 5/15]
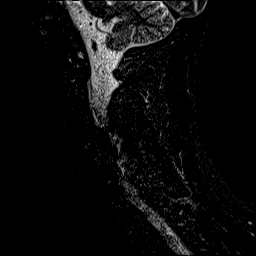
[im 10/15]
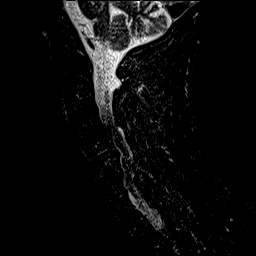
[im 15/15]
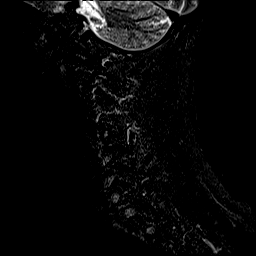

[Series 104: T2 · axial · 3.0mm · 0.70mm/px · z∈[-87,-4]mm · 8 of 25 slices shown (2 of 2)]
[im 1/25]
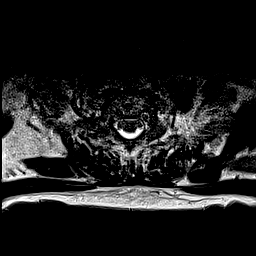
[im 4/25]
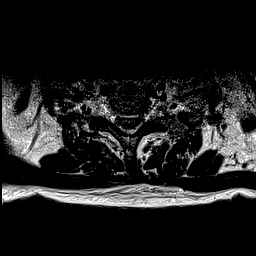
[im 7/25]
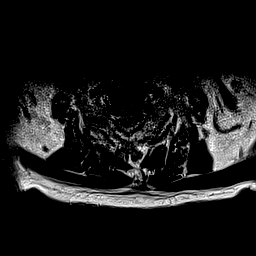
[im 11/25]
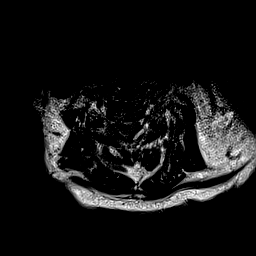
[im 14/25]
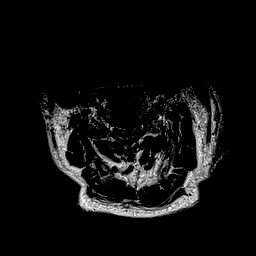
[im 18/25]
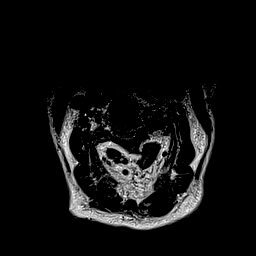
[im 21/25]
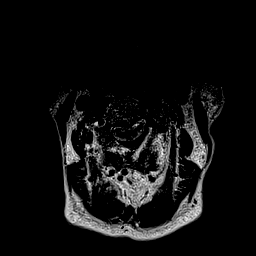
[im 25/25]
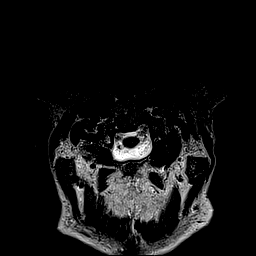

[Series 105: mpgr ax_fil_1 · axial · 3.0mm · 0.35mm/px · z∈[-87,-42]mm · 5 of 25 slices shown]
[im 1/25]
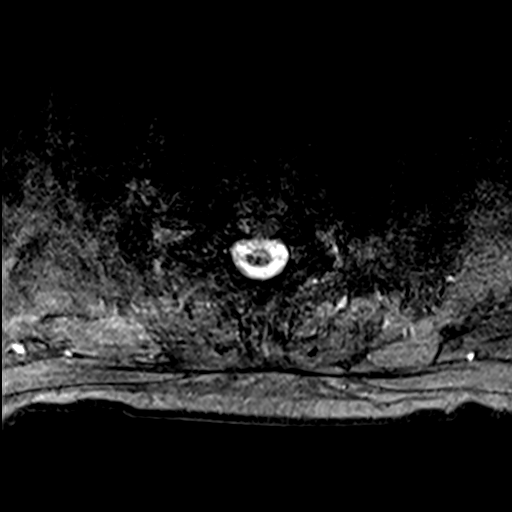
[im 4/25]
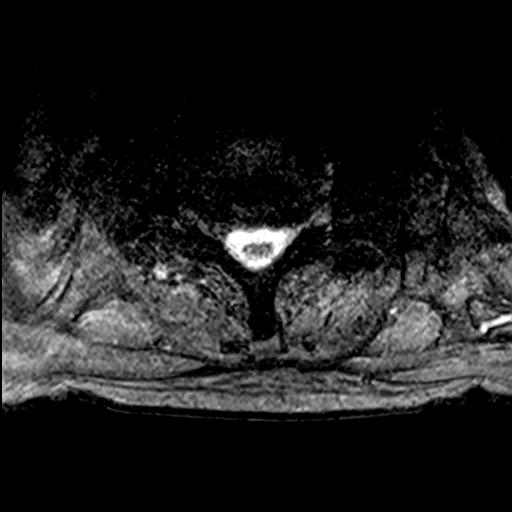
[im 7/25]
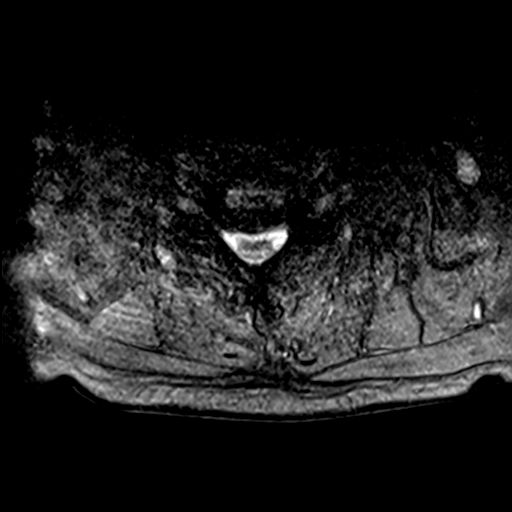
[im 11/25]
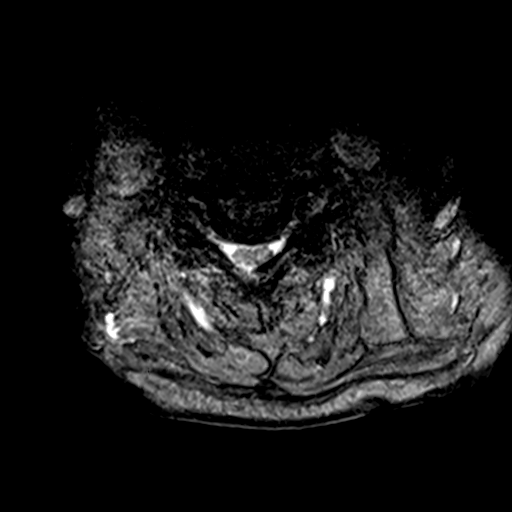
[im 14/25]
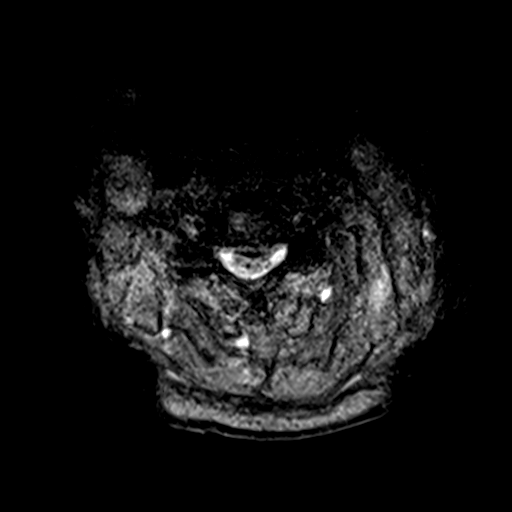

[Series 106: T1 · axial · 3.0mm · 0.70mm/px · z∈[-87,-4]mm · 7 of 24 slices shown (2 of 2)]
[im 1/24]
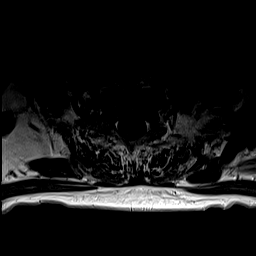
[im 4/24]
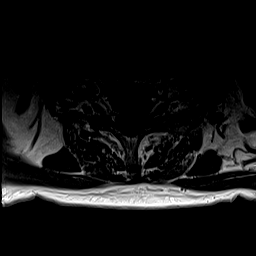
[im 8/24]
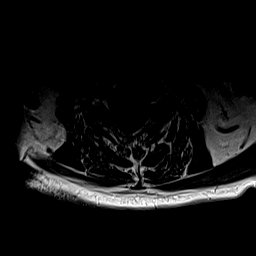
[im 12/24]
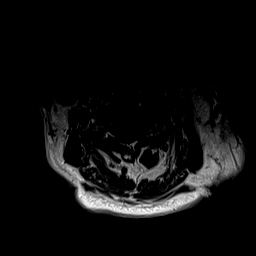
[im 16/24]
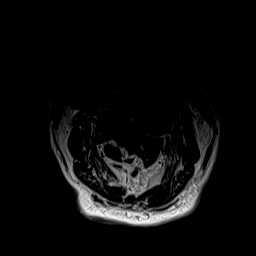
[im 20/24]
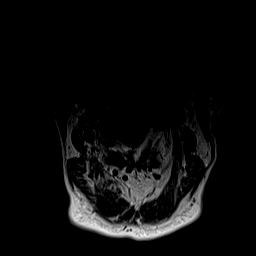
[im 24/24]
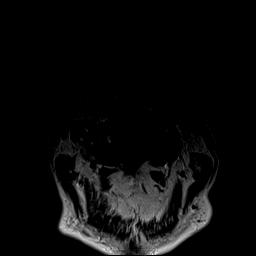

[Series 108: T1 post-contrast · axial · 3.0mm · 0.70mm/px · z∈[-87,-4]mm · 8 of 25 slices shown]
[im 1/25]
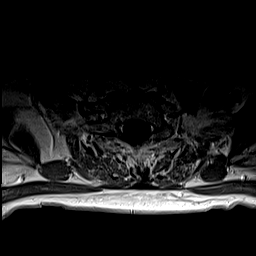
[im 4/25]
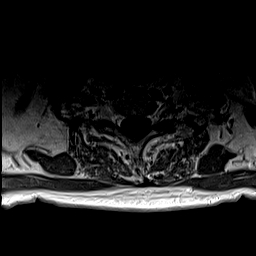
[im 7/25]
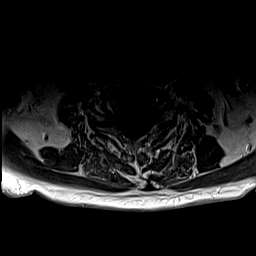
[im 11/25]
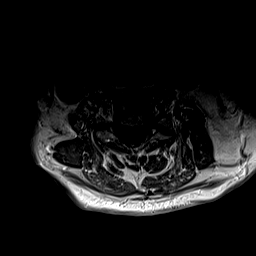
[im 14/25]
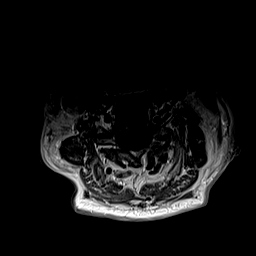
[im 18/25]
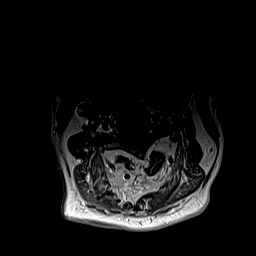
[im 21/25]
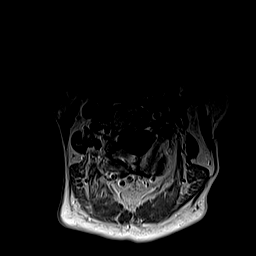
[im 25/25]
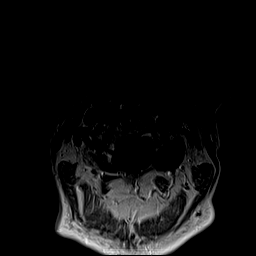

[Series 109: T1 fat-sat post-contrast · sagittal · 3.0mm · 0.70mm/px · 5 of 15 slices shown]
[im 1/15]
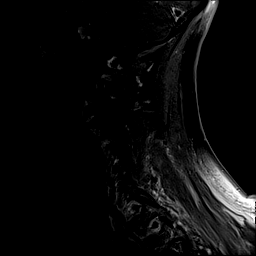
[im 4/15]
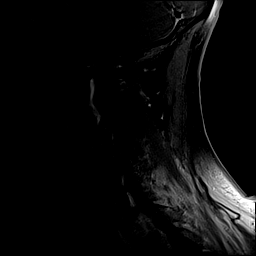
[im 8/15]
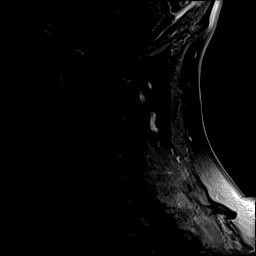
[im 11/15]
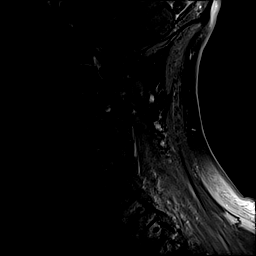
[im 15/15]
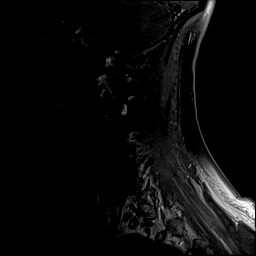

[45 of 48 positions shown; findings below may reference images not displayed]

FINDINGS: MR cervical FINDINGS:

Alignment: Significant anterolisthesis C3-4 of 4 mm is chronic,
unchanged from prior prior CTA. Slight retrolisthesis C5-6 also
chronic.

Vertebrae: No worrisome osseous lesions.

Cord: Severe cord compression at C3-4 is multifactorial related to
slip and disc material described below. Moderate cord compression at
C4-5 and C5-6 is multifactorial as well. No abnormal cord signal at
any level.

Posterior Fossa: Unremarkable.

Vertebral Arteries: Patent and equal.

Paraspinal tissues: Unremarkable.

Disc levels:

The individual disc spaces were examined as follows:

C2-3: Central protrusion. Slight effacement anterior subarachnoid
space but no definite impingement.

C3-4: Moderate cord compression due to slip and central extrusion.
Canal diameter 5 mm. BILATERAL C4 nerve root impingement.

C4-5: Central protrusion. Moderate cord flattening. LEFT greater
than RIGHT C5 nerve root impingement.

C5-6: 2 mm retrolisthesis. Central disc osteophyte complex. Mild
cord flattening. Canal diameter 6 mm. BILATERAL C6 nerve root
impingement.

C6-7:  Disc space narrowing but no stenosis or neural impingement.

C7-T1:  Unremarkable.

MR thoracic FINDINGS:

Chronic compression deformity of L1. Abnormal bone marrow edema at
in T7 and T11 is observed with mild compression. While there is more
involvement of the entire T7 vertebral body than there is at T11
where there is primarily superior endplate depression, there is no
posterior element involvement or other osseous lesions. The degree
of compression is approximately similar to that noted on prior CT
chest 2 weeks ago. Slight sclerosis at T11 as seen on CT may reflect
a slightly older fracture, but the sclerosis does not correspond to
areas of abnormal MR enhancement to a significant degree. Benign
posttraumatic compression deformities are favored but metastatic
disease cannot completely be excluded. If vertebral augmentations
performed, recommend bone biopsy.

No definite destructive osseous lesions are seen. There is no
epidural tumor. No abnormal enhancing lesions in the spinal canal or
within the cord. No paravertebral masses. Trace BILATERAL pleural
effusions. No significant disc protrusion. Slight chronic
retrolisthesis superior endplate L1.
IMPRESSION: Severe multilevel cervical spondylosis as described. Spinal stenosis
with cord compression is observed at C3-4, C4-5, and C5-6. No
abnormal cord signal at any level.

No cervical spine metastatic disease is evident.

Enhancing compression deformities at T7 and T11. These are favored
to be posttraumatic/ osteopenic related although metastatic disease
not completely excluded. Similar healed compression deformity
helpful L1 appears similar. Continued surveillance is warranted.

## 2016-04-21 IMAGING — MR MR THORACIC SPINE WO/W CM
7 of 9 series · 34 of 48 positions shown · IV contrast (14ML MULTIHANCE)
Comparison: CT chest 01/01/2015.  CTA neck 06/26/2014.

CLINICAL DATA: Neck pain and upper back pain. Coughing. History of
esophageal cancer.

EXAM:
MRI CERVICAL SPINE WITHOUT AND WITH CONTRAST; MRI THORACIC SPINE
WITHOUT AND WITH CONTRAST
TECHNIQUE: Multiplanar and multiecho pulse sequences of the cervical spine, to
include the craniocervical junction and cervicothoracic junction,
were obtained according to standard protocol without and with
intravenous contrast.; Multiplanar and multiecho pulse sequences of
the thoracic spine were obtained without and with intravenous
contrast.
CONTRAST:  14mL MULTIHANCE GADOBENATE DIMEGLUMINE 529 MG/ML IV SOLN

[Series 5: T2 · sagittal · 4.0mm · 1.33mm/px · 2 of 15 slices shown (1 of 2)]
[im 1/15]
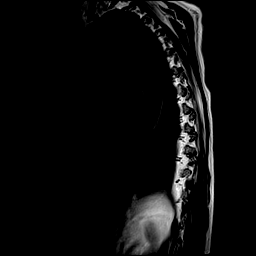
[im 15/15]
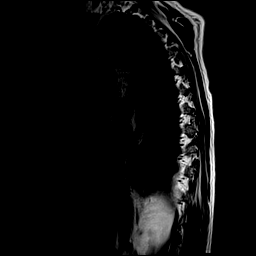

[Series 6: T1 · sagittal · 4.0mm · 1.33mm/px · 3 of 15 slices shown (1 of 2)]
[im 1/15]
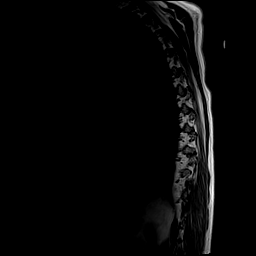
[im 8/15]
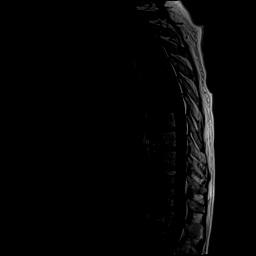
[im 15/15]
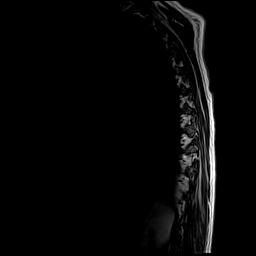

[Series 7: STIR · sagittal · 4.0mm · 1.33mm/px · 3 of 15 slices shown]
[im 1/15]
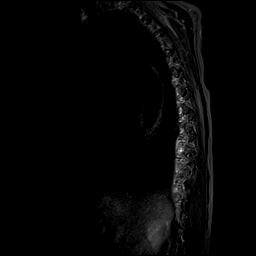
[im 8/15]
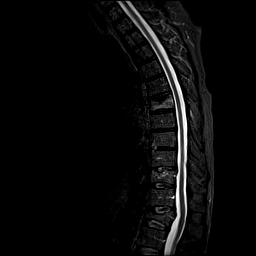
[im 15/15]
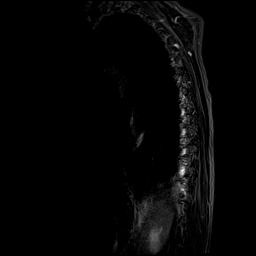

[Series 8: T2 · axial · 5.0mm · 0.86mm/px · z∈[-320,-93]mm · 9 of 42 slices shown (2 of 2)]
[im 1/42]
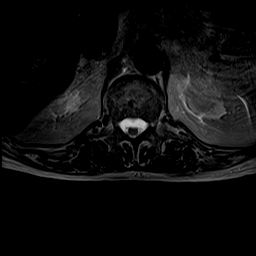
[im 6/42]
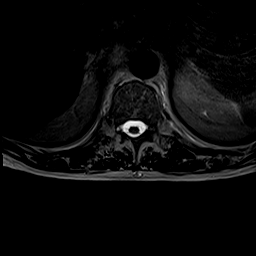
[im 11/42]
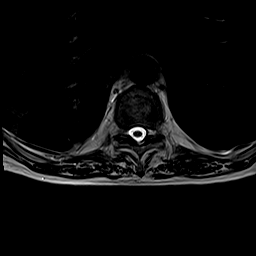
[im 16/42]
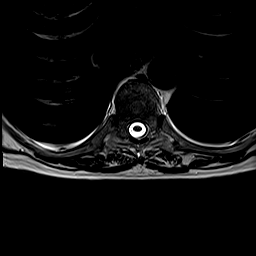
[im 21/42]
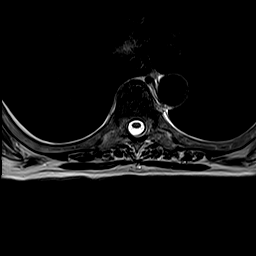
[im 26/42]
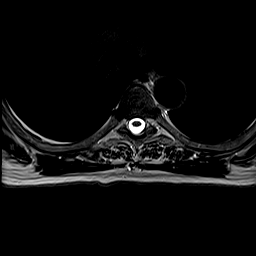
[im 31/42]
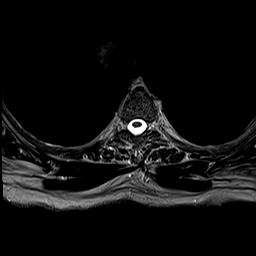
[im 36/42]
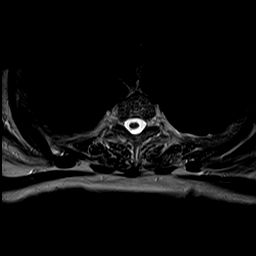
[im 42/42]
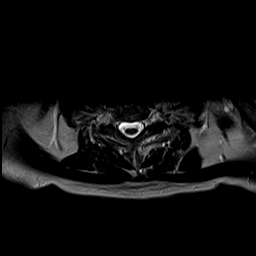

[Series 10: T1 · axial · non-contrast · 5.0mm · 0.43mm/px · z∈[-322,-82]mm · 9 of 42 slices shown (2 of 2)]
[im 1/42]
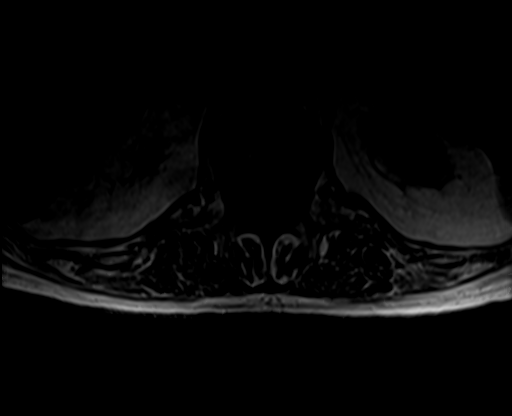
[im 6/42]
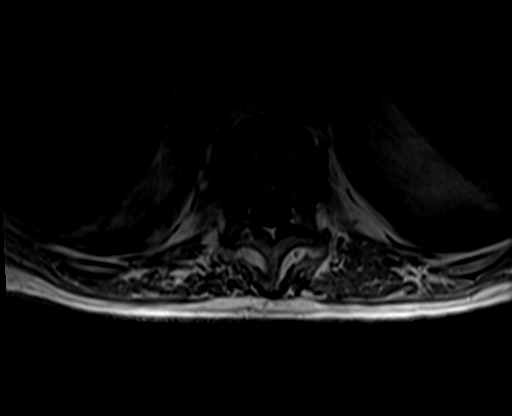
[im 11/42]
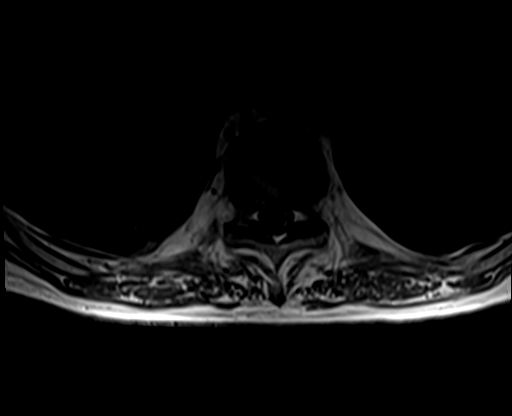
[im 16/42]
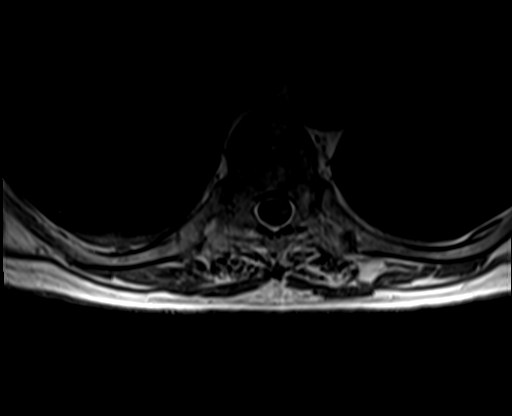
[im 21/42]
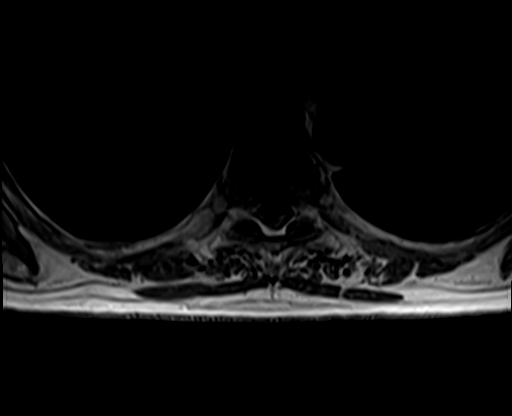
[im 26/42]
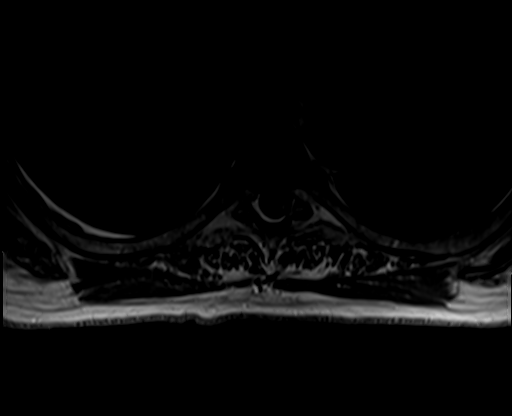
[im 31/42]
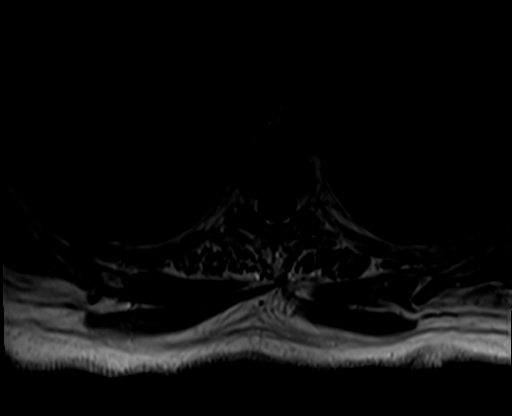
[im 36/42]
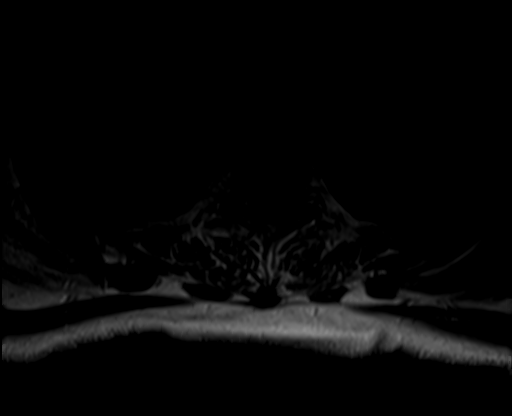
[im 42/42]
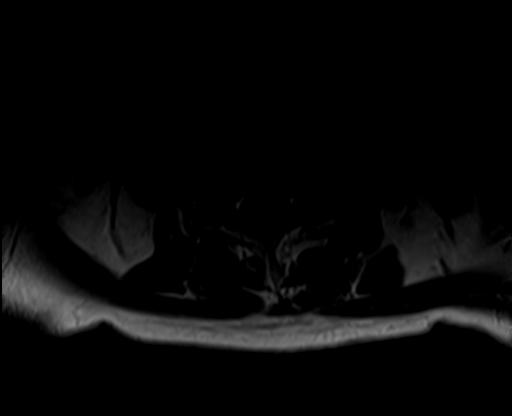

[Series 11: T1 fat-sat post-contrast · sagittal · 4.0mm · 1.33mm/px · 3 of 15 slices shown]
[im 1/15]
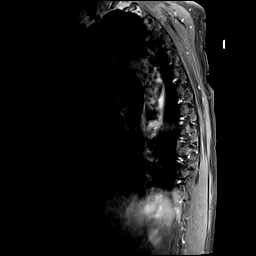
[im 8/15]
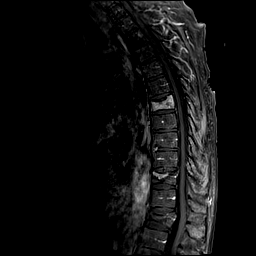
[im 15/15]
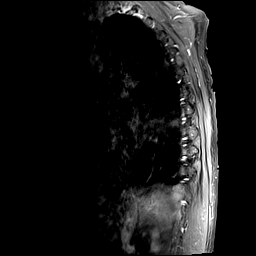

[Series 12: T1 post-contrast · axial · 5.0mm · 0.43mm/px · z∈[-322,-159]mm · 5 of 42 slices shown]
[im 1/42]
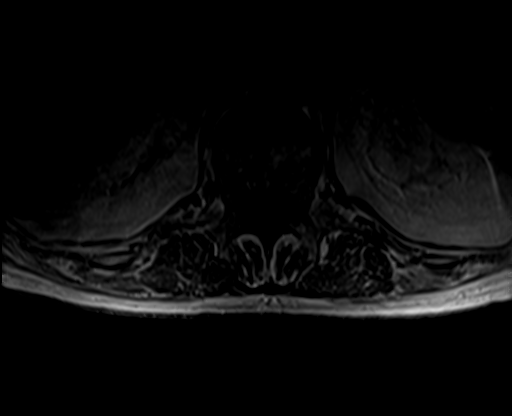
[im 6/42]
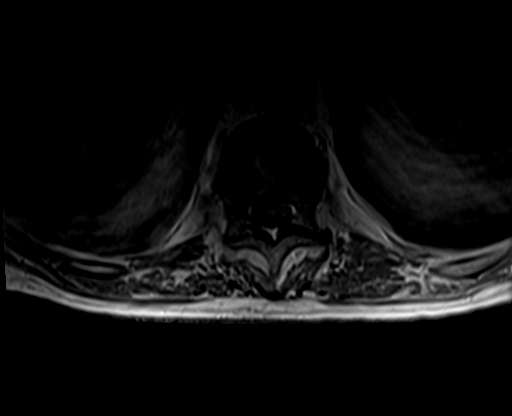
[im 11/42]
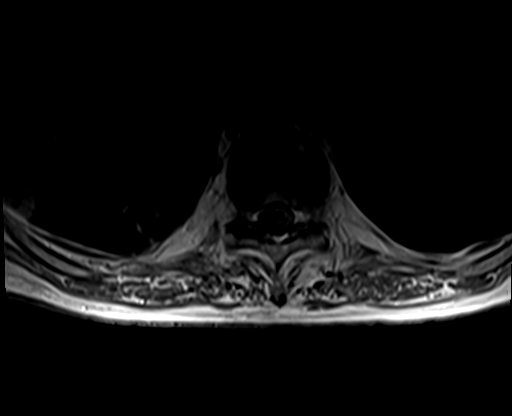
[im 16/42]
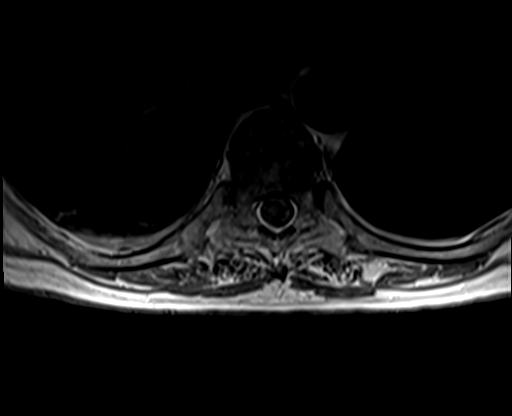
[im 26/42]
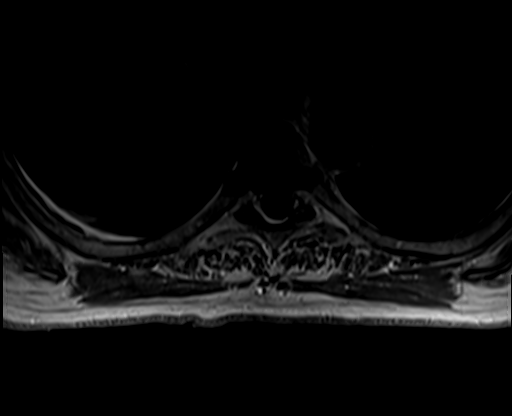

[34 of 48 positions shown; findings below may reference images not displayed]

FINDINGS: MR cervical FINDINGS:

Alignment: Significant anterolisthesis C3-4 of 4 mm is chronic,
unchanged from prior prior CTA. Slight retrolisthesis C5-6 also
chronic.

Vertebrae: No worrisome osseous lesions.

Cord: Severe cord compression at C3-4 is multifactorial related to
slip and disc material described below. Moderate cord compression at
C4-5 and C5-6 is multifactorial as well. No abnormal cord signal at
any level.

Posterior Fossa: Unremarkable.

Vertebral Arteries: Patent and equal.

Paraspinal tissues: Unremarkable.

Disc levels:

The individual disc spaces were examined as follows:

C2-3: Central protrusion. Slight effacement anterior subarachnoid
space but no definite impingement.

C3-4: Moderate cord compression due to slip and central extrusion.
Canal diameter 5 mm. BILATERAL C4 nerve root impingement.

C4-5: Central protrusion. Moderate cord flattening. LEFT greater
than RIGHT C5 nerve root impingement.

C5-6: 2 mm retrolisthesis. Central disc osteophyte complex. Mild
cord flattening. Canal diameter 6 mm. BILATERAL C6 nerve root
impingement.

C6-7:  Disc space narrowing but no stenosis or neural impingement.

C7-T1:  Unremarkable.

MR thoracic FINDINGS:

Chronic compression deformity of L1. Abnormal bone marrow edema at
in T7 and T11 is observed with mild compression. While there is more
involvement of the entire T7 vertebral body than there is at T11
where there is primarily superior endplate depression, there is no
posterior element involvement or other osseous lesions. The degree
of compression is approximately similar to that noted on prior CT
chest 2 weeks ago. Slight sclerosis at T11 as seen on CT may reflect
a slightly older fracture, but the sclerosis does not correspond to
areas of abnormal MR enhancement to a significant degree. Benign
posttraumatic compression deformities are favored but metastatic
disease cannot completely be excluded. If vertebral augmentations
performed, recommend bone biopsy.

No definite destructive osseous lesions are seen. There is no
epidural tumor. No abnormal enhancing lesions in the spinal canal or
within the cord. No paravertebral masses. Trace BILATERAL pleural
effusions. No significant disc protrusion. Slight chronic
retrolisthesis superior endplate L1.
IMPRESSION: Severe multilevel cervical spondylosis as described. Spinal stenosis
with cord compression is observed at C3-4, C4-5, and C5-6. No
abnormal cord signal at any level.

No cervical spine metastatic disease is evident.

Enhancing compression deformities at T7 and T11. These are favored
to be posttraumatic/ osteopenic related although metastatic disease
not completely excluded. Similar healed compression deformity
helpful L1 appears similar. Continued surveillance is warranted.

## 2016-06-08 ENCOUNTER — Inpatient Hospital Stay: Payer: Medicare Other | Admitting: Oncology

## 2016-06-28 DIAGNOSIS — D696 Thrombocytopenia, unspecified: Secondary | ICD-10-CM | POA: Insufficient documentation

## 2016-06-28 NOTE — Progress Notes (Signed)
[place]  Telephone:([phone] [phone]  ID: Charles Rose OB: [birth year]  MR#: [id number]  QBH#:[id number]  Patient Care Team: Tracie Harrier, MD as PCP - General (Internal Medicine)  CHIEF COMPLAINT: Thrombocytopenia, Beta-thalassemia minor.  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 He is referred for further evaluation. He currently feels well and is asymptomatic. He has no neurologic complaints. He denies any recent fevers or illnesses. He has a good appetite and denies weight loss. He has no chest pain or shortness of breath. He denies any nausea, vomiting, constipation, or diarrhea. He has no urinary complaints. He denies any easy bleeding or bruising. Patient feels at his baseline and offers no specific complaints today.  REVIEW OF SYSTEMS:   Review of Systems  Constitutional: Negative.  Negative for fever and malaise/fatigue.  Respiratory: Negative.  Negative for cough and shortness of breath.   Cardiovascular: Negative.  Negative for chest pain and leg swelling.  Genitourinary: Negative.   Musculoskeletal: Negative.   Neurological: Negative.  Negative for weakness.  Endo/Heme/Allergies: Does not bruise/bleed easily.  Psychiatric/Behavioral: Negative.  The patient is not nervous/anxious.     As per HPI. Otherwise, a complete review of systems is negative.  PAST MEDICAL HISTORY: Past Medical History:  Diagnosis Date  . Anemia   . Anxiety   . Asthma   . Cancer (HCC)    esophagus  . COPD (chronic obstructive pulmonary disease) ([place])   . Coronary artery disease   . Hyperlipidemia   . Thalassemia   . Thrombocytopenia ([place])     PAST SURGICAL HISTORY: Past Surgical History:  Procedure Laterality Date  . CORONARY STENT PLACEMENT    . ESOPHAGUS SURGERY      FAMILY HISTORY: Family History  Problem Relation Age of Onset  . Diabetes Other   .  Diabetes Mother   . Congestive Heart Failure Father   . Skin cancer Brother   . Heart disease Paternal Uncle     ADVANCED DIRECTIVES (Y/N):  N  HEALTH MAINTENANCE: Social History  Substance Use Topics  . Smoking status: Former Research scientist (life sciences)  . Smokeless tobacco: Never Used  . Alcohol use No     Colonoscopy:  PAP:  Bone density:  Lipid panel:  No Known Allergies  Current Outpatient Prescriptions  Medication Sig Dispense Refill  . albuterol (PROVENTIL HFA;VENTOLIN HFA) 108 (90 BASE) MCG/ACT inhaler Inhale 2 puffs into the lungs 2 (two) times daily.     Marland Kitchen ALPRAZolam (XANAX) 0.25 MG tablet Take 0.25 mg by mouth 2 (two) times daily as needed for anxiety.    Marland Kitchen aspirin EC 81 MG tablet Take by mouth.    . budesonide-formoterol (SYMBICORT) 160-4.5 MCG/ACT inhaler Inhale into the lungs.    . calcium carbonate (OS-CAL - DOSED IN MG OF ELEMENTAL CALCIUM) 1250 (500 Ca) MG tablet Take by mouth.    . feeding supplement, ENSURE ENLIVE, (ENSURE ENLIVE) LIQD Take 237 mLs by mouth 2 (two) times daily between meals. 237 mL 12  . ferrous sulfate 325 (65 FE) MG tablet Take 325 mg by mouth daily with breakfast.    . HYDROcodone-acetaminophen (NORCO/VICODIN) 5-325 MG per tablet Take 1 tablet by mouth 2 (two) times daily.    Marland Kitchen ipratropium-albuterol (DUONEB) 0.5-2.5 (3) MG/3ML SOLN Take 3 mLs by nebulization 3 (three) times daily.    . isosorbide mononitrate (IMDUR) 30 MG 24 hr tablet Take 30 mg by mouth daily.    . montelukast (  SINGULAIR) 10 MG tablet Take by mouth.    . nitroGLYCERIN (NITROSTAT) 0.4 MG SL tablet Place 0.4 mg under the tongue every 5 (five) minutes as needed for chest pain.    . Omega-3 Fatty Acids (FISH OIL PO) Take by mouth.    . ranitidine (ZANTAC) 150 MG tablet Take 150 mg by mouth 2 (two) times daily.    . simvastatin (ZOCOR) 40 MG tablet Take 40 mg by mouth daily at 6 PM.     No current facility-administered medications for this visit.     OBJECTIVE: Vitals:   06/30/16 1417  BP:  (!) 92/55  Pulse: 80  Resp: 20  Temp: 98.7 F (37.1 C)     Body mass index is 23.83 kg/m.    ECOG FS:0 - Asymptomatic  General: Well-developed, well-nourished, no acute distress. Eyes: Pink conjunctiva, anicteric sclera. HEENT: Normocephalic, moist mucous membranes, clear oropharnyx. Lungs: Clear to auscultation bilaterally. Heart: Regular rate and rhythm. No rubs, murmurs, or gallops. Abdomen: Soft, nontender, nondistended. No organomegaly noted, normoactive bowel sounds. Musculoskeletal: No edema, cyanosis, or clubbing. Neuro: Alert, answering all questions appropriately. Cranial nerves grossly intact. Skin: No rashes or petechiae noted. Psych: Normal affect. Lymphatics: No cervical, calvicular, axillary or inguinal LAD.   LAB RESULTS:  Lab Results  Component Value Date   NA 142 03/01/2015   K 3.8 03/01/2015   CL 109 03/01/2015   CO2 29 03/01/2015   GLUCOSE 84 03/01/2015   BUN 11 03/01/2015   CREATININE 0.81 03/01/2015   CALCIUM 8.5 (L) 03/01/2015   PROT 6.2 (L) 02/28/2015   ALBUMIN 3.8 02/28/2015   AST 23 02/28/2015   ALT 15 (L) 02/28/2015   ALKPHOS 89 02/28/2015   BILITOT 0.9 02/28/2015   GFRNONAA >60 03/01/2015   GFRAA >60 03/01/2015    Lab Results  Component Value Date   WBC 5.3 06/30/2016   NEUTROABS 5.3 02/28/2015   HGB 9.5 (L) 06/30/2016   HCT 29.3 (L) 06/30/2016   MCV 66.6 (L) 06/30/2016   PLT 123 (L) 06/30/2016   Lab Results  Component Value Date   IRON 102 06/30/2016   TIBC 261 06/30/2016   IRONPCTSAT 39 06/30/2016   Lab Results  Component Value Date   FERRITIN 264 06/30/2016     STUDIES: No results found.  ASSESSMENT: Thrombocytopenia, Beta-thalassemia minor  PLAN:    1. Thrombocytopenia: Patient's platelet count is decreased, but essentially unchanged since December 2014. All of his other laboratory work including SPEP and platelet antibodies are either negative or within normal limits. No intervention is needed at this time.  Patient does not require bone marrow biopsy. Return to clinic in 3 months with repeat laboratory work and further evaluation. If patient's platelet count remains essentially unchanged, he likely can be discharged from clinic. 2. 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 Patient's hemoglobin and MCV are decreased secondary to his thalassemia. Iron stores are within normal limits. No intervention is needed.  Patient expressed understanding and was in agreement with this plan. He also understands that He can call clinic at any time with any questions, concerns, or complaints.    Lloyd Huger, MD   07/02/2016 2:46 PM

## 2016-06-30 ENCOUNTER — Inpatient Hospital Stay: Payer: Medicare Other

## 2016-06-30 ENCOUNTER — Encounter: Payer: Self-pay | Admitting: Oncology

## 2016-06-30 ENCOUNTER — Inpatient Hospital Stay: Payer: Medicare Other | Attending: Oncology | Admitting: Oncology

## 2016-06-30 DIAGNOSIS — J449 Chronic obstructive pulmonary disease, unspecified: Secondary | ICD-10-CM | POA: Diagnosis not present

## 2016-06-30 DIAGNOSIS — F419 Anxiety disorder, unspecified: Secondary | ICD-10-CM | POA: Diagnosis not present

## 2016-06-30 DIAGNOSIS — D563 Thalassemia minor: Secondary | ICD-10-CM | POA: Diagnosis not present

## 2016-06-30 DIAGNOSIS — D696 Thrombocytopenia, unspecified: Secondary | ICD-10-CM

## 2016-06-30 DIAGNOSIS — Z809 Family history of malignant neoplasm, unspecified: Secondary | ICD-10-CM | POA: Insufficient documentation

## 2016-06-30 DIAGNOSIS — Z7982 Long term (current) use of aspirin: Secondary | ICD-10-CM | POA: Insufficient documentation

## 2016-06-30 DIAGNOSIS — I2510 Atherosclerotic heart disease of native coronary artery without angina pectoris: Secondary | ICD-10-CM | POA: Insufficient documentation

## 2016-06-30 DIAGNOSIS — D649 Anemia, unspecified: Secondary | ICD-10-CM

## 2016-06-30 DIAGNOSIS — Z8501 Personal history of malignant neoplasm of esophagus: Secondary | ICD-10-CM | POA: Diagnosis not present

## 2016-06-30 DIAGNOSIS — Z87891 Personal history of nicotine dependence: Secondary | ICD-10-CM | POA: Diagnosis not present

## 2016-06-30 DIAGNOSIS — Z79899 Other long term (current) drug therapy: Secondary | ICD-10-CM | POA: Diagnosis not present

## 2016-06-30 DIAGNOSIS — E785 Hyperlipidemia, unspecified: Secondary | ICD-10-CM

## 2016-06-30 LAB — CBC
HEMATOCRIT: 29.3 % — AB (ref 40.0–52.0)
Hemoglobin: 9.5 g/dL — ABNORMAL LOW (ref 13.0–18.0)
MCH: 21.5 pg — ABNORMAL LOW (ref 26.0–34.0)
MCHC: 32.3 g/dL (ref 32.0–36.0)
MCV: 66.6 fL — AB (ref 80.0–100.0)
Platelets: 123 10*3/uL — ABNORMAL LOW (ref 150–440)
RBC: 4.41 MIL/uL (ref 4.40–5.90)
RDW: 15.3 % — AB (ref 11.5–14.5)
WBC: 5.3 10*3/uL (ref 3.8–10.6)

## 2016-06-30 LAB — LACTATE DEHYDROGENASE: LDH: 155 U/L (ref 98–192)

## 2016-06-30 LAB — IRON AND TIBC
Iron: 102 ug/dL (ref 45–182)
Saturation Ratios: 39 % (ref 17.9–39.5)
TIBC: 261 ug/dL (ref 250–450)
UIBC: 159 ug/dL

## 2016-06-30 LAB — FERRITIN: Ferritin: 264 ng/mL (ref 24–336)

## 2016-06-30 LAB — VITAMIN B12: Vitamin B-12: 2578 pg/mL — ABNORMAL HIGH (ref 180–914)

## 2016-06-30 LAB — FOLATE: FOLATE: 20.1 ng/mL (ref >5.9)

## 2016-06-30 NOTE — Progress Notes (Signed)
Patient is here today for new patient. He does mention some shortness of breath.

## 2016-07-01 LAB — PROTEIN ELECTROPHORESIS, SERUM
A/G RATIO SPE: 1.7 (ref 0.7–1.7)
ALBUMIN ELP: 3.5 g/dL (ref 2.9–4.4)
Alpha-1-Globulin: 0.2 g/dL (ref 0.0–0.4)
Alpha-2-Globulin: 0.6 g/dL (ref 0.4–1.0)
Beta Globulin: 0.8 g/dL (ref 0.7–1.3)
GAMMA GLOBULIN: 0.5 g/dL (ref 0.4–1.8)
GLOBULIN, TOTAL: 2.1 g/dL — AB (ref 2.2–3.9)
PDF: 0
TOTAL PROTEIN ELP: 5.6 g/dL — AB (ref 6.0–8.5)

## 2016-07-01 LAB — HEMOGLOBINOPATHY EVALUATION
HGB A2 QUANT: 4.2 % — AB (ref 1.8–3.2)
Hgb A: 95.8 % — ABNORMAL LOW (ref 96.4–98.8)
Hgb C: 0 %
Hgb F Quant: 0 % (ref 0.0–2.0)
Hgb S Quant: 0 %
Hgb Variant: 0 %

## 2016-07-01 LAB — PLATELET ANTIBODY PROFILE, SERUM
HLA Ab Ser Ql EIA: NEGATIVE
IA/IIA Antibody: NEGATIVE
IB/IX Antibody: NEGATIVE
IIB/IIIA ANTIBODY: NEGATIVE

## 2016-07-01 LAB — HAPTOGLOBIN: HAPTOGLOBIN: 46 mg/dL (ref 34–200)

## 2016-07-02 DIAGNOSIS — D563 Thalassemia minor: Secondary | ICD-10-CM | POA: Insufficient documentation

## 2016-09-29 ENCOUNTER — Ambulatory Visit: Payer: Medicare Other | Admitting: Oncology

## 2016-09-29 ENCOUNTER — Other Ambulatory Visit: Payer: Medicare Other

## 2016-10-01 ENCOUNTER — Other Ambulatory Visit: Payer: Self-pay

## 2016-10-01 DIAGNOSIS — D696 Thrombocytopenia, unspecified: Secondary | ICD-10-CM

## 2016-10-04 ENCOUNTER — Inpatient Hospital Stay: Payer: Medicare Other

## 2016-10-04 ENCOUNTER — Inpatient Hospital Stay: Payer: Medicare Other | Admitting: Oncology

## 2016-10-04 NOTE — Progress Notes (Deleted)
[place]  [phone] Fax:(336) [phone]  ID: Charles Rose OB: 04/18/1941  MR#: [id number]  TKW#:[id number]  Patient Care Team: Tracie Harrier, MD as PCP - General (Internal Medicine)  CHIEF COMPLAINT: Thrombocytopenia, Beta-thalassemia minor.  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 He is referred for further evaluation. He currently feels well and is asymptomatic. He has no neurologic complaints. He denies any recent fevers or illnesses. He has a good appetite and denies weight loss. He has no chest pain or shortness of breath. He denies any nausea, vomiting, constipation, or diarrhea. He has no urinary complaints. He denies any easy bleeding or bruising. Patient feels at his baseline and offers no specific complaints today.  REVIEW OF SYSTEMS:   Review of Systems  Constitutional: Negative.  Negative for fever and malaise/fatigue.  Respiratory: Negative.  Negative for cough and shortness of breath.   Cardiovascular: Negative.  Negative for chest pain and leg swelling.  Genitourinary: Negative.   Musculoskeletal: Negative.   Neurological: Negative.  Negative for weakness.  Endo/Heme/Allergies: Does not bruise/bleed easily.  Psychiatric/Behavioral: Negative.  The patient is not nervous/anxious.     As per HPI. Otherwise, a complete review of systems is negative.  PAST MEDICAL HISTORY: Past Medical History:  Diagnosis Date  . Anemia   . Anxiety   . Asthma   . Cancer (HCC)    esophagus  . COPD (chronic obstructive pulmonary disease) ([place])   . Coronary artery disease   . Hyperlipidemia   . Thalassemia   . Thrombocytopenia ([place])     PAST SURGICAL HISTORY: Past Surgical History:  Procedure Laterality Date  . CORONARY STENT PLACEMENT    . ESOPHAGUS SURGERY      FAMILY HISTORY: Family History  Problem Relation Age of Onset  . Diabetes Other   .  Diabetes Mother   . Congestive Heart Failure Father   . Skin cancer Brother   . Heart disease Paternal Uncle     ADVANCED DIRECTIVES (Y/N):  N  HEALTH MAINTENANCE: Social History  Substance Use Topics  . Smoking status: Former Research scientist (life sciences)  . Smokeless tobacco: Never Used  . Alcohol use No     Colonoscopy:  PAP:  Bone density:  Lipid panel:  No Known Allergies  Current Outpatient Prescriptions  Medication Sig Dispense Refill  . albuterol (PROVENTIL HFA;VENTOLIN HFA) 108 (90 BASE) MCG/ACT inhaler Inhale 2 puffs into the lungs 2 (two) times daily.     Marland Kitchen ALPRAZolam (XANAX) 0.25 MG tablet Take 0.25 mg by mouth 2 (two) times daily as needed for anxiety.    Marland Kitchen aspirin EC 81 MG tablet Take by mouth.    . budesonide-formoterol (SYMBICORT) 160-4.5 MCG/ACT inhaler Inhale into the lungs.    . calcium carbonate (OS-CAL - DOSED IN MG OF ELEMENTAL CALCIUM) 1250 (500 Ca) MG tablet Take by mouth.    . feeding supplement, ENSURE ENLIVE, (ENSURE ENLIVE) LIQD Take 237 mLs by mouth 2 (two) times daily between meals. 237 mL 12  . ferrous sulfate 325 (65 FE) MG tablet Take 325 mg by mouth daily with breakfast.    . HYDROcodone-acetaminophen (NORCO/VICODIN) 5-325 MG per tablet Take 1 tablet by mouth 2 (two) times daily.    Marland Kitchen ipratropium-albuterol (DUONEB) 0.5-2.5 (3) MG/3ML SOLN Take 3 mLs by nebulization 3 (three) times daily.    . isosorbide mononitrate (IMDUR) 30 MG 24 hr tablet Take 30 mg by mouth daily.    . montelukast (  SINGULAIR) 10 MG tablet Take by mouth.    . nitroGLYCERIN (NITROSTAT) 0.4 MG SL tablet Place 0.4 mg under the tongue every 5 (five) minutes as needed for chest pain.    . Omega-3 Fatty Acids (FISH OIL PO) Take by mouth.    . ranitidine (ZANTAC) 150 MG tablet Take 150 mg by mouth 2 (two) times daily.    . simvastatin (ZOCOR) 40 MG tablet Take 40 mg by mouth daily at 6 PM.     No current facility-administered medications for this visit.     OBJECTIVE: There were no vitals filed for  this visit.   There is no height or weight on file to calculate BMI.    ECOG FS:0 - Asymptomatic  General: Well-developed, well-nourished, no acute distress. Eyes: Pink conjunctiva, anicteric sclera. HEENT: Normocephalic, moist mucous membranes, clear oropharnyx. Lungs: Clear to auscultation bilaterally. Heart: Regular rate and rhythm. No rubs, murmurs, or gallops. Abdomen: Soft, nontender, nondistended. No organomegaly noted, normoactive bowel sounds. Musculoskeletal: No edema, cyanosis, or clubbing. Neuro: Alert, answering all questions appropriately. Cranial nerves grossly intact. Skin: No rashes or petechiae noted. Psych: Normal affect. Lymphatics: No cervical, calvicular, axillary or inguinal LAD.   LAB RESULTS:  Lab Results  Component Value Date   NA 142 03/01/2015   K 3.8 03/01/2015   CL 109 03/01/2015   CO2 29 03/01/2015   GLUCOSE 84 03/01/2015   BUN 11 03/01/2015   CREATININE 0.81 03/01/2015   CALCIUM 8.5 (L) 03/01/2015   PROT 6.2 (L) 02/28/2015   ALBUMIN 3.8 02/28/2015   AST 23 02/28/2015   ALT 15 (L) 02/28/2015   ALKPHOS 89 02/28/2015   BILITOT 0.9 02/28/2015   GFRNONAA >60 03/01/2015   GFRAA >60 03/01/2015    Lab Results  Component Value Date   WBC 5.3 06/30/2016   NEUTROABS 5.3 02/28/2015   HGB 9.5 (L) 06/30/2016   HCT 29.3 (L) 06/30/2016   MCV 66.6 (L) 06/30/2016   PLT 123 (L) 06/30/2016   Lab Results  Component Value Date   IRON 102 06/30/2016   TIBC 261 06/30/2016   IRONPCTSAT 39 06/30/2016   Lab Results  Component Value Date   FERRITIN 264 06/30/2016     STUDIES: No results found.  ASSESSMENT: Thrombocytopenia, Beta-thalassemia minor  PLAN:    1. Thrombocytopenia: Patient's platelet count is decreased, but essentially unchanged since December 2014. All of his other laboratory work including SPEP and platelet antibodies are either negative or within normal limits. No intervention is needed at this time. Patient does not require bone  marrow biopsy. Return to clinic in 3 months with repeat laboratory work and further evaluation. If patient's platelet count remains essentially unchanged, he likely can be discharged from clinic. 2. 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 Patient's hemoglobin and MCV are decreased secondary to his thalassemia. Iron stores are within normal limits. No intervention is needed.  Patient expressed understanding and was in agreement with this plan. He also understands that He can call clinic at any time with any questions, concerns, or complaints.    Lloyd Huger, MD   10/04/2016 12:09 AM

## 2016-10-17 NOTE — Progress Notes (Signed)
[place]  Telephone:(336) [phone] Fax:(336) [phone]  ID: Charles Rose OB: Jan 11, 1941  MR#: [id number]  QRF#:[id number]  Patient Care Team: Tracie Harrier, MD as PCP - General (Internal Medicine)  CHIEF COMPLAINT: Thrombocytopenia, Beta-thalassemia minor.  INTERVAL HISTORY: Patient returns to clinic for repeat laboratory work and further evaluation. He continues to feel well and is asymptomatic. He has no neurologic complaints. He denies any recent fevers or illnesses. He has a good appetite and denies weight loss. He has no chest pain or shortness of breath. He denies any nausea, vomiting, constipation, or diarrhea. He has no urinary complaints. He denies any easy bleeding or bruising. Patient feels at his baseline and offers no specific complaints today.  REVIEW OF SYSTEMS:   Review of Systems  Constitutional: Negative.  Negative for fever and malaise/fatigue.  Respiratory: Negative.  Negative for cough and shortness of breath.   Cardiovascular: Negative.  Negative for chest pain and leg swelling.  Genitourinary: Negative.   Musculoskeletal: Negative.   Neurological: Negative.  Negative for weakness.  Endo/Heme/Allergies: Does not bruise/bleed easily.  Psychiatric/Behavioral: Negative.  The patient is not nervous/anxious.     As per HPI. Otherwise, a complete review of systems is negative.  PAST MEDICAL HISTORY: Past Medical History:  Diagnosis Date  . Anemia   . Anxiety   . Asthma   . Cancer (HCC)    esophagus  . COPD (chronic obstructive pulmonary disease) ([place])   . Coronary artery disease   . Hyperlipidemia   . Thalassemia   . Thrombocytopenia ([place])     PAST SURGICAL HISTORY: Past Surgical History:  Procedure Laterality Date  . CORONARY STENT PLACEMENT    . ESOPHAGUS SURGERY      FAMILY HISTORY: Family History  Problem Relation Age of Onset  . Diabetes Other   . Diabetes Mother   . Congestive Heart Failure Father   . Skin  cancer Brother   . Heart disease Paternal Uncle     ADVANCED DIRECTIVES (Y/N):  N  HEALTH MAINTENANCE: Social History  Substance Use Topics  . Smoking status: Former Research scientist (life sciences)  . Smokeless tobacco: Never Used  . Alcohol use No     Colonoscopy:  PAP:  Bone density:  Lipid panel:  No Known Allergies  Current Outpatient Prescriptions  Medication Sig Dispense Refill  . albuterol (PROVENTIL HFA;VENTOLIN HFA) 108 (90 BASE) MCG/ACT inhaler Inhale 2 puffs into the lungs 2 (two) times daily.     Marland Kitchen ALPRAZolam (XANAX) 0.25 MG tablet Take 0.25 mg by mouth 2 (two) times daily as needed for anxiety.    Marland Kitchen aspirin EC 81 MG tablet Take by mouth.    . calcium carbonate (OS-CAL - DOSED IN MG OF ELEMENTAL CALCIUM) 1250 (500 Ca) MG tablet Take by mouth.    . feeding supplement, ENSURE ENLIVE, (ENSURE ENLIVE) LIQD Take 237 mLs by mouth 2 (two) times daily between meals. 237 mL 12  . ferrous sulfate 325 (65 FE) MG tablet Take 325 mg by mouth daily with breakfast.    . HYDROcodone-acetaminophen (NORCO/VICODIN) 5-325 MG per tablet Take 1 tablet by mouth 2 (two) times daily.    Marland Kitchen ipratropium-albuterol (DUONEB) 0.5-2.5 (3) MG/3ML SOLN Take 3 mLs by nebulization 3 (three) times daily.    . isosorbide mononitrate (IMDUR) 30 MG 24 hr tablet Take 30 mg by mouth daily.    . montelukast (SINGULAIR) 10 MG tablet Take by mouth.    . nitroGLYCERIN (NITROSTAT) 0.4 MG SL tablet Place 0.4 mg under the  tongue every 5 (five) minutes as needed for chest pain.    . Omega-3 Fatty Acids (FISH OIL PO) Take by mouth.    . ranitidine (ZANTAC) 150 MG tablet Take 150 mg by mouth 2 (two) times daily.    . simvastatin (ZOCOR) 40 MG tablet Take 40 mg by mouth daily at 6 PM.    . budesonide-formoterol (SYMBICORT) 160-4.5 MCG/ACT inhaler Inhale into the lungs.     No current facility-administered medications for this visit.     OBJECTIVE: Vitals:   10/18/16 1154  BP: 116/73  Pulse: 87  Resp: 18  Temp: 98.2 F (36.8 C)      Body mass index is 23.11 kg/m.    ECOG FS:0 - Asymptomatic  General: Well-developed, well-nourished, no acute distress. Eyes: Pink conjunctiva, anicteric sclera. Lungs: Clear to auscultation bilaterally. Heart: Regular rate and rhythm. No rubs, murmurs, or gallops. Abdomen: Soft, nontender, nondistended. No organomegaly noted, normoactive bowel sounds. Musculoskeletal: No edema, cyanosis, or clubbing. Neuro: Alert, answering all questions appropriately. Cranial nerves grossly intact. Skin: No rashes or petechiae noted. Psych: Normal affect.   LAB RESULTS:  Lab Results  Component Value Date   NA 142 03/01/2015   K 3.8 03/01/2015   CL 109 03/01/2015   CO2 29 03/01/2015   GLUCOSE 84 03/01/2015   BUN 11 03/01/2015   CREATININE 0.81 03/01/2015   CALCIUM 8.5 (L) 03/01/2015   PROT 6.2 (L) 02/28/2015   ALBUMIN 3.8 02/28/2015   AST 23 02/28/2015   ALT 15 (L) 02/28/2015   ALKPHOS 89 02/28/2015   BILITOT 0.9 02/28/2015   GFRNONAA >60 03/01/2015   GFRAA >60 03/01/2015    Lab Results  Component Value Date   WBC 6.4 10/18/2016   NEUTROABS 5.3 10/18/2016   HGB 9.7 (L) 10/18/2016   HCT 30.1 (L) 10/18/2016   MCV 66.7 (L) 10/18/2016   PLT 106 (L) 10/18/2016   Lab Results  Component Value Date   IRON 102 06/30/2016   TIBC 261 06/30/2016   IRONPCTSAT 39 06/30/2016   Lab Results  Component Value Date   FERRITIN 264 06/30/2016     STUDIES: No results found.  ASSESSMENT: Thrombocytopenia, Beta-thalassemia minor  PLAN:    1. Thrombocytopenia: Patient's platelet count continues to be decreased at 106, but essentially unchanged since December 2014. All of his other laboratory work including SPEP and platelet antibodies are either negative or within normal limits. No intervention is needed at this time. Patient does not require bone marrow biopsy. After lengthy discussion with the patient, he wishes no further follow-up. Please continue to monitor his platelet count 1 or 2 times  a year and if it decreases and approaches 50, please refer him back for further evaluation.  2. 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 Patient's hemoglobin and MCV are decreased secondary to his thalassemia. Iron stores are within normal limits. No intervention is needed.  Patient expressed understanding and was in agreement with this plan. He also understands that He can call clinic at any time with any questions, concerns, or complaints.    Lloyd Huger, MD   10/18/2016 12:28 PM

## 2016-10-18 ENCOUNTER — Inpatient Hospital Stay: Payer: Medicare Other | Attending: Oncology | Admitting: Oncology

## 2016-10-18 ENCOUNTER — Inpatient Hospital Stay: Payer: Medicare Other

## 2016-10-18 VITALS — BP 116/73 | HR 87 | Temp 98.2°F | Resp 18 | Wt 156.5 lb

## 2016-10-18 DIAGNOSIS — D696 Thrombocytopenia, unspecified: Secondary | ICD-10-CM

## 2016-10-18 DIAGNOSIS — Z85828 Personal history of other malignant neoplasm of skin: Secondary | ICD-10-CM | POA: Insufficient documentation

## 2016-10-18 DIAGNOSIS — J45909 Unspecified asthma, uncomplicated: Secondary | ICD-10-CM

## 2016-10-18 DIAGNOSIS — E785 Hyperlipidemia, unspecified: Secondary | ICD-10-CM | POA: Diagnosis not present

## 2016-10-18 DIAGNOSIS — Z7982 Long term (current) use of aspirin: Secondary | ICD-10-CM | POA: Diagnosis not present

## 2016-10-18 DIAGNOSIS — F419 Anxiety disorder, unspecified: Secondary | ICD-10-CM | POA: Diagnosis not present

## 2016-10-18 DIAGNOSIS — I2510 Atherosclerotic heart disease of native coronary artery without angina pectoris: Secondary | ICD-10-CM | POA: Diagnosis not present

## 2016-10-18 DIAGNOSIS — Z87891 Personal history of nicotine dependence: Secondary | ICD-10-CM | POA: Diagnosis not present

## 2016-10-18 DIAGNOSIS — D563 Thalassemia minor: Secondary | ICD-10-CM | POA: Insufficient documentation

## 2016-10-18 DIAGNOSIS — J449 Chronic obstructive pulmonary disease, unspecified: Secondary | ICD-10-CM | POA: Diagnosis not present

## 2016-10-18 DIAGNOSIS — D649 Anemia, unspecified: Secondary | ICD-10-CM | POA: Insufficient documentation

## 2016-10-18 LAB — CBC WITH DIFFERENTIAL/PLATELET
BASOS PCT: 0 %
Basophils Absolute: 0 10*3/uL (ref 0–0.1)
EOS ABS: 0.1 10*3/uL (ref 0–0.7)
Eosinophils Relative: 1 %
HEMATOCRIT: 30.1 % — AB (ref 40.0–52.0)
HEMOGLOBIN: 9.7 g/dL — AB (ref 13.0–18.0)
LYMPHS ABS: 0.7 10*3/uL — AB (ref 1.0–3.6)
Lymphocytes Relative: 11 %
MCH: 21.6 pg — AB (ref 26.0–34.0)
MCHC: 32.4 g/dL (ref 32.0–36.0)
MCV: 66.7 fL — ABNORMAL LOW (ref 80.0–100.0)
MONO ABS: 0.4 10*3/uL (ref 0.2–1.0)
MONOS PCT: 5 %
NEUTROS ABS: 5.3 10*3/uL (ref 1.4–6.5)
Neutrophils Relative %: 83 %
Platelets: 106 10*3/uL — ABNORMAL LOW (ref 150–440)
RBC: 4.51 MIL/uL (ref 4.40–5.90)
RDW: 15.8 % — AB (ref 11.5–14.5)
WBC: 6.4 10*3/uL (ref 3.8–10.6)

## 2016-10-18 NOTE — Progress Notes (Signed)
Complains of chronic neck pain.

## 2016-11-10 ENCOUNTER — Other Ambulatory Visit (INDEPENDENT_AMBULATORY_CARE_PROVIDER_SITE_OTHER): Payer: Self-pay | Admitting: Vascular Surgery

## 2016-11-10 DIAGNOSIS — I6523 Occlusion and stenosis of bilateral carotid arteries: Secondary | ICD-10-CM

## 2016-11-11 ENCOUNTER — Ambulatory Visit (INDEPENDENT_AMBULATORY_CARE_PROVIDER_SITE_OTHER): Payer: Medicare Other | Admitting: Vascular Surgery

## 2016-11-11 ENCOUNTER — Encounter (INDEPENDENT_AMBULATORY_CARE_PROVIDER_SITE_OTHER): Payer: Self-pay | Admitting: Vascular Surgery

## 2016-11-11 ENCOUNTER — Ambulatory Visit (INDEPENDENT_AMBULATORY_CARE_PROVIDER_SITE_OTHER): Payer: Medicare Other

## 2016-11-11 VITALS — BP 109/60 | HR 86 | Resp 16 | Wt 152.8 lb

## 2016-11-11 DIAGNOSIS — J439 Emphysema, unspecified: Secondary | ICD-10-CM | POA: Diagnosis not present

## 2016-11-11 DIAGNOSIS — I6523 Occlusion and stenosis of bilateral carotid arteries: Secondary | ICD-10-CM

## 2016-11-11 DIAGNOSIS — I25118 Atherosclerotic heart disease of native coronary artery with other forms of angina pectoris: Secondary | ICD-10-CM

## 2016-11-11 DIAGNOSIS — I209 Angina pectoris, unspecified: Secondary | ICD-10-CM | POA: Diagnosis not present

## 2016-11-11 DIAGNOSIS — K219 Gastro-esophageal reflux disease without esophagitis: Secondary | ICD-10-CM

## 2016-11-11 DIAGNOSIS — I6529 Occlusion and stenosis of unspecified carotid artery: Secondary | ICD-10-CM | POA: Insufficient documentation

## 2016-11-11 NOTE — Progress Notes (Signed)
MRN : [id number]  Charles Rose is a 75 y.o. (12-14-1940) male who presents with chief complaint of No chief complaint on file. Marland Kitchen  History of Present Illness: The patient is seen for follow up evaluation of carotid stenosis. The carotid stenosis followed by ultrasound.   The patient denies amaurosis fugax. There is no recent history of TIA symptoms or focal motor deficits. There is no prior documented CVA.  The patient is taking enteric-coated aspirin 81 mg daily.  There is no history of migraine headaches. There is no history of seizures.  The patient has a history of coronary artery disease, no recent episodes of angina or shortness of breath. The patient denies PAD or claudication symptoms. There is a history of hyperlipidemia which is being treated with a statin.    Carotid Duplex done today shows widely patent right carotid stent and <40% LICA.  No change compared to last study  No outpatient prescriptions have been marked as taking for the 11/11/16 encounter (Appointment) with Delana Meyer, Dolores Lory, MD.    Past Medical History:  Diagnosis Date  . Anemia   . Anxiety   . Asthma   . Cancer (HCC)    esophagus  . COPD (chronic obstructive pulmonary disease) ([place])   . Coronary artery disease   . Hyperlipidemia   . Thalassemia   . Thrombocytopenia ([place])     Past Surgical History:  Procedure Laterality Date  . CORONARY STENT PLACEMENT    . ESOPHAGUS SURGERY      Social History Social History  Substance Use Topics  . Smoking status: Former Research scientist (life sciences)  . Smokeless tobacco: Never Used  . Alcohol use No    Family History Family History  Problem Relation Age of Onset  . Diabetes Other   . Diabetes Mother   . Congestive Heart Failure Father   . Skin cancer Brother   . Heart disease Paternal Uncle     No Known Allergies   REVIEW OF SYSTEMS (Negative unless checked)  Constitutional: [] Weight loss  [] Fever  [] Chills Cardiac: [] Chest pain   [] Chest pressure    [] Palpitations   [] Shortness of breath when laying flat   [x] Shortness of breath with exertion. Vascular:  [] Pain in legs with walking   [] Pain in legs at rest  [] History of DVT   [] Phlebitis   [] Swelling in legs   [] Varicose veins   [] Non-healing ulcers Pulmonary:   [] Uses home oxygen   [] Productive cough   [] Hemoptysis   [] Wheeze  [x] COPD   [x] Asthma Neurologic:  [] Dizziness   [] Seizures   [] History of stroke   [] History of TIA  [] Aphasia   [] Vissual changes   [] Weakness or numbness in arm   [] Weakness or numbness in leg Musculoskeletal:   [] Joint swelling   [x] Joint pain   [x] Low back pain Hematologic:  [] Easy bruising  [] Easy bleeding   [] Hypercoagulable state   [] Anemic Gastrointestinal:  [] Diarrhea   [] Vomiting  [] Gastroesophageal reflux/heartburn   [] Difficulty swallowing. Genitourinary:  [] Chronic kidney disease   [] Difficult urination  [] Frequent urination   [] Blood in urine Skin:  [] Rashes   [] Ulcers  Psychological:  [] History of anxiety   []  History of major depression.  Physical Examination  There were no vitals filed for this visit. There is no height or weight on file to calculate BMI. Gen: WD/WN, NAD Head: [place]/AT, No temporalis wasting.  Ear/Nose/Throat: Hearing grossly intact, nares w/o erythema or drainage Eyes: PER, EOMI, sclera nonicteric.  Neck: Supple, no large masses.  Pulmonary:  Good air movement, no audible wheezing bilaterally, no use of accessory muscles.  Cardiac: RRR, no JVD Vascular: no carotid bruits noted Vessel Right Left  Radial Palpable Palpable  Ulnar Palpable Palpable  Brachial Palpable Palpable  Carotid Palpable Palpable  Gastrointestinal: Non-distended. No guarding/no peritoneal signs.  Musculoskeletal: M/S 5/5 throughout.  No deformity or atrophy.  Neurologic: CN 2-12 intact. Symmetrical.  Speech is fluent. Motor exam as listed above. Psychiatric: Judgment intact, Mood & affect appropriate for pt's clinical situation. Dermatologic: No rashes or  ulcers noted.  No changes consistent with cellulitis. Lymph : No lichenification or skin changes of chronic lymphedema.  CBC Lab Results  Component Value Date   WBC 6.4 10/18/2016   HGB 9.7 (L) 10/18/2016   HCT 30.1 (L) 10/18/2016   MCV 66.7 (L) 10/18/2016   PLT 106 (L) 10/18/2016    BMET    Component Value Date/Time   NA 142 03/01/2015 0438   NA 141 07/25/2014 0403   K 3.8 03/01/2015 0438   K 3.6 07/25/2014 0403   CL 109 03/01/2015 0438   CL 106 07/25/2014 0403   CO2 29 03/01/2015 0438   CO2 29 07/25/2014 0403   GLUCOSE 84 03/01/2015 0438   GLUCOSE 86 07/25/2014 0403   BUN 11 03/01/2015 0438   BUN 11 07/25/2014 0403   CREATININE 0.81 03/01/2015 0438   CREATININE 0.99 07/25/2014 0403   CALCIUM 8.5 (L) 03/01/2015 0438   CALCIUM 8.3 (L) 07/25/2014 0403   GFRNONAA >60 03/01/2015 0438   GFRNONAA >60 07/25/2014 0403   GFRNONAA >60 05/26/2013 1553   GFRAA >60 03/01/2015 0438   GFRAA >60 07/25/2014 0403   GFRAA >60 05/26/2013 1553   CrCl cannot be calculated (Patient's most recent lab result is older than the maximum 21 days allowed.).  COAG Lab Results  Component Value Date   INR 1.16 02/28/2015   INR 1.2 07/25/2014    Radiology No results found.  Assessment/Plan 1. Bilateral carotid artery stenosis Recommend:  Given the patient's asymptomatic subcritical stenosis no further invasive testing or surgery at this time.  Duplex ultrasound shows <30% stenosis bilaterally with widely patent RICA stent.  Continue antiplatelet therapy as prescribed Continue management of CAD, HTN and Hyperlipidemia Healthy heart diet,  encouraged exercise at least 4 times per week Follow up in 12 months with duplex ultrasound and physical exam   2. Coronary artery disease of native artery of native heart with stable angina pectoris (HCC) Continue cardiac and antihypertensive medications as already ordered and reviewed, no changes at this time.  Continue statin as ordered and  reviewed, no changes at this time  Nitrates PRN for chest pain   3. Pulmonary emphysema, unspecified emphysema type ([place]) Continue pulmonary medications and aerosols as already ordered, these medications have been reviewed and there are no changes at this time.    4. Gastroesophageal reflux disease without esophagitis Continue PPI as already ordered, these medications have been reviewed and there are no changes at this time.    Hortencia Pilar, MD  11/11/2016 8:24 AM

## 2017-11-17 ENCOUNTER — Encounter (INDEPENDENT_AMBULATORY_CARE_PROVIDER_SITE_OTHER): Payer: Medicare Other

## 2017-11-17 ENCOUNTER — Ambulatory Visit (INDEPENDENT_AMBULATORY_CARE_PROVIDER_SITE_OTHER): Payer: Medicare Other | Admitting: Vascular Surgery

## 2018-08-17 ENCOUNTER — Other Ambulatory Visit: Payer: Self-pay | Admitting: Gastroenterology

## 2018-08-17 DIAGNOSIS — Z86010 Personal history of colonic polyps: Secondary | ICD-10-CM

## 2018-12-29 ENCOUNTER — Ambulatory Visit
Admission: RE | Admit: 2018-12-29 | Discharge: 2018-12-29 | Disposition: A | Discharge status: Home / Self Care | Payer: Medicare Other | Source: Ambulatory Visit | Attending: Gastroenterology | Admitting: Gastroenterology

## 2018-12-29 ENCOUNTER — Other Ambulatory Visit: Payer: Self-pay

## 2018-12-29 DIAGNOSIS — Z86010 Personal history of colonic polyps: Secondary | ICD-10-CM | POA: Diagnosis not present

## 2022-04-06 ENCOUNTER — Other Ambulatory Visit: Payer: Self-pay | Admitting: Family Medicine

## 2022-04-06 DIAGNOSIS — R911 Solitary pulmonary nodule: Secondary | ICD-10-CM

## 2022-10-12 ENCOUNTER — Other Ambulatory Visit: Payer: Self-pay | Admitting: Internal Medicine

## 2022-10-12 DIAGNOSIS — C159 Malignant neoplasm of esophagus, unspecified: Secondary | ICD-10-CM

## 2022-10-12 DIAGNOSIS — J449 Chronic obstructive pulmonary disease, unspecified: Secondary | ICD-10-CM

## 2022-10-12 DIAGNOSIS — J9611 Chronic respiratory failure with hypoxia: Secondary | ICD-10-CM

## 2022-10-12 DIAGNOSIS — R634 Abnormal weight loss: Secondary | ICD-10-CM

## 2022-10-20 ENCOUNTER — Ambulatory Visit
Admission: RE | Admit: 2022-10-20 | Discharge: 2022-10-20 | Disposition: A | Discharge status: Home / Self Care | Payer: Medicare Other | Source: Ambulatory Visit | Attending: Internal Medicine | Admitting: Internal Medicine

## 2022-10-20 DIAGNOSIS — R634 Abnormal weight loss: Secondary | ICD-10-CM | POA: Diagnosis present

## 2022-10-20 DIAGNOSIS — J9611 Chronic respiratory failure with hypoxia: Secondary | ICD-10-CM

## 2022-10-20 DIAGNOSIS — C159 Malignant neoplasm of esophagus, unspecified: Secondary | ICD-10-CM | POA: Insufficient documentation

## 2022-10-20 DIAGNOSIS — J449 Chronic obstructive pulmonary disease, unspecified: Secondary | ICD-10-CM | POA: Diagnosis present

## 2022-10-20 MED ORDER — IOHEXOL 300 MG/ML  SOLN
75.0000 mL | Freq: Once | INTRAMUSCULAR | Status: AC | PRN
  Administered 2022-10-20: 75 mL via INTRAVENOUS

## 2023-01-06 ENCOUNTER — Other Ambulatory Visit: Payer: Self-pay | Admitting: Internal Medicine

## 2023-01-06 DIAGNOSIS — R918 Other nonspecific abnormal finding of lung field: Secondary | ICD-10-CM

## 2023-01-26 ENCOUNTER — Ambulatory Visit
Admission: RE | Admit: 2023-01-26 | Discharge: 2023-01-26 | Disposition: A | Discharge status: Home / Self Care | Payer: Medicare Other | Source: Ambulatory Visit | Attending: Internal Medicine | Admitting: Internal Medicine

## 2023-01-26 DIAGNOSIS — R918 Other nonspecific abnormal finding of lung field: Secondary | ICD-10-CM | POA: Insufficient documentation

## 2024-06-09 ENCOUNTER — Emergency Department

## 2024-06-09 ENCOUNTER — Other Ambulatory Visit: Payer: Self-pay

## 2024-06-09 ENCOUNTER — Emergency Department: Admission: EM | Admit: 2024-06-09 | Discharge: 2024-06-09 | Disposition: A | Discharge status: Home / Self Care

## 2024-06-09 ENCOUNTER — Encounter: Payer: Self-pay | Admitting: Emergency Medicine

## 2024-06-09 DIAGNOSIS — Z8501 Personal history of malignant neoplasm of esophagus: Secondary | ICD-10-CM | POA: Diagnosis not present

## 2024-06-09 DIAGNOSIS — J449 Chronic obstructive pulmonary disease, unspecified: Secondary | ICD-10-CM | POA: Diagnosis not present

## 2024-06-09 DIAGNOSIS — M7989 Other specified soft tissue disorders: Secondary | ICD-10-CM | POA: Diagnosis not present

## 2024-06-09 DIAGNOSIS — R2242 Localized swelling, mass and lump, left lower limb: Secondary | ICD-10-CM

## 2024-06-09 DIAGNOSIS — M25572 Pain in left ankle and joints of left foot: Secondary | ICD-10-CM | POA: Diagnosis present

## 2024-06-09 DIAGNOSIS — I2510 Atherosclerotic heart disease of native coronary artery without angina pectoris: Secondary | ICD-10-CM | POA: Diagnosis not present

## 2024-06-09 DIAGNOSIS — M79672 Pain in left foot: Secondary | ICD-10-CM | POA: Diagnosis not present

## 2024-06-09 LAB — BASIC METABOLIC PANEL WITH GFR
Anion gap: 7 (ref 5–15)
BUN: 14 mg/dL (ref 8–23)
CO2: 30 mmol/L (ref 22–32)
Calcium: 9.1 mg/dL (ref 8.9–10.3)
Chloride: 105 mmol/L (ref 98–111)
Creatinine, Ser: 0.71 mg/dL (ref 0.61–1.24)
GFR, Estimated: >60 mL/min
Glucose, Bld: 89 mg/dL (ref 70–99)
Potassium: 4.5 mmol/L (ref 3.5–5.1)
Sodium: 143 mmol/L (ref 135–145)

## 2024-06-09 LAB — CBC WITH DIFFERENTIAL/PLATELET
Abs Immature Granulocytes: 0.05 K/uL (ref 0.00–0.07)
Basophils Absolute: 0 K/uL (ref 0.0–0.1)
Basophils Relative: 0 %
Eosinophils Absolute: 0.2 K/uL (ref 0.0–0.5)
Eosinophils Relative: 2 %
HCT: 37.2 % — ABNORMAL LOW (ref 39.0–52.0)
Hemoglobin: 11 g/dL — ABNORMAL LOW (ref 13.0–17.0)
Immature Granulocytes: 1 %
Lymphocytes Relative: 13 %
Lymphs Abs: 1 K/uL (ref 0.7–4.0)
MCH: 19.8 pg — ABNORMAL LOW (ref 26.0–34.0)
MCHC: 29.6 g/dL — ABNORMAL LOW (ref 30.0–36.0)
MCV: 67 fL — ABNORMAL LOW (ref 80.0–100.0)
Monocytes Absolute: 0.6 K/uL (ref 0.1–1.0)
Monocytes Relative: 7 %
Neutro Abs: 6.3 K/uL (ref 1.7–7.7)
Neutrophils Relative %: 77 %
Platelets: 106 K/uL — ABNORMAL LOW (ref 150–400)
RBC: 5.55 MIL/uL (ref 4.22–5.81)
RDW: 15.4 % (ref 11.5–15.5)
WBC: 8.2 K/uL (ref 4.0–10.5)
nRBC: 0 % (ref 0.0–0.2)

## 2024-06-09 LAB — URIC ACID: Uric Acid, Serum: 4.3 mg/dL (ref 3.7–8.6)

## 2024-06-09 MED ORDER — ACETAMINOPHEN 500 MG PO TABS
1000.0000 mg | ORAL_TABLET | Freq: Once | ORAL | Status: DC
  Filled 2024-06-09: qty 2

## 2024-06-09 MED ORDER — COLCHICINE 0.6 MG PO TABS: 0.6000 mg | ORAL_TABLET | Freq: Every day | ORAL | 0 refills | Status: AC | PRN

## 2024-06-09 NOTE — ED Provider Notes (Signed)
 "  [facility] Provider Note    Event Date/Time   First MD Initiated Contact with Patient 06/09/24 1651     (approximate)   History   Foot Swelling  Pt reports left foot pain since last Wednesday. Swelling noted to foot and ankle. Baseline 4L [place] for COPD.    HPI Charles Rose is a 83 y.o. 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  Remains ambulatory.  Usually wears compression stockings but had to take them off because it was uncomfortable. - No preceding trauma - No fever - No overlying rash - No chest pain, shortness of breath - Has otherwise been in his usual state of health        Physical Exam   Triage Vital Signs: ED Triage Vitals  Encounter Vitals Group     BP 06/09/24 1508 (!) 162/85     Girls Systolic BP Percentile --      Girls Diastolic BP Percentile --      Boys Systolic BP Percentile --      Boys Diastolic BP Percentile --      Pulse Rate 06/09/24 1508 80     Resp 06/09/24 1508 17     Temp 06/09/24 1508 97.8 F (36.6 C)     Temp Source 06/09/24 1508 Oral     SpO2 06/09/24 1508 98 %     Weight 06/09/24 1510 152 lb 1.9 oz (69 kg)     Height 06/09/24 1510 5' 9 (1.753 m)     Head Circumference --      Peak Flow --      Pain Score 06/09/24 1510 0     Pain Loc --      Pain Education --      Exclude from Growth Chart --     Most recent vital signs: Vitals:   06/09/24 1810 06/09/24 1918  BP:  (!) 154/72  Pulse:  82  Resp:  18  Temp:  98.3 F (36.8 C)  SpO2: 98% 98%     General: Awake, no distress.  CV:  Good peripheral perfusion. RRR, RP 2+ Resp:  Normal effort. CTAB Abd:  No distention. Nontender to deep palpation throughout Other:  Mild-moderate swelling to left foot extending up through mid shin.  Pedal pulse palpable.  Wiggles  toes.  No appreciable tenderness to palpation throughout.  No overlying erythema or warmth.  Able to range all joints and bear weight.   ED Results / Procedures / Treatments   Labs (all labs ordered are listed, but only abnormal results are displayed) Labs Reviewed  CBC WITH DIFFERENTIAL/PLATELET - Abnormal; Notable for the following components:      Result Value   Hemoglobin 11.0 (*)    HCT 37.2 (*)    MCV 67.0 (*)    MCH 19.8 (*)    MCHC 29.6 (*)    Platelets 106 (*)    All other components within normal limits  BASIC METABOLIC PANEL WITH GFR  URIC ACID     EKG  N/a   RADIOLOGY Radiology interpreted by myself radiology reports reviewed.  No acute pathology identified.    PROCEDURES:  Critical Care performed: No  Procedures   MEDICATIONS ORDERED IN ED: Medications  acetaminophen  (TYLENOL ) tablet 1,000 mg (1,000 mg Oral Not Given 06/09/24 1813)  IMPRESSION / MDM / ASSESSMENT AND PLAN / ED COURSE  I reviewed the triage vital signs and the nursing notes.                              DDX/MDM/AP: Differential diagnosis includes, but is not limited to, venous stasis / lymphedema, doubt underlying fx, exam not c/w cellulitis, consider underlying DVT, gout.  No clinical concern for septic joint.  Plan: - Labs  -Tylenol  -  XR left foot -DVT ultrasound  Patient's presentation is most consistent with acute presentation with potential threat to life or bodily function.  The patient is on the cardiac monitor to evaluate for evidence of arrhythmia and/or significant heart rate changes.  ED course below.  Workup unremarkable.  Do not clinically suspect septic joint.  Consider possibility of mild gout.  In shared decision making, family did want to prefer colchicine  no strong family history of gout.  Rx low-dose colchicine .  Plan for PMD follow-up.  ED return precautions in place.  Patient agrees with plan.  Clinical Course as of 06/09/24 2041  Sat Jun 09, 2024   1659 BMP reviewed, unremarkable [MM]  1659 CBC with improved microcytic anemia, no leukocytosis [MM]  1700 XR L foot: IMPRESSION: No acute osseous abnormality. Dorsal soft tissue swelling.   [MM]  1841 DVT US : IMPRESSION: 1. No evidence of DVT.   [MM]    Clinical Course User Index [MM] Clarine Ozell LABOR, MD     FINAL CLINICAL IMPRESSION(S) / ED DIAGNOSES   Final diagnoses:  Acute left ankle pain  Localized swelling of left lower leg     Rx / DC Orders   ED Discharge Orders          Ordered    colchicine  0.6 MG tablet  Daily PRN        06/09/24 1905             Note:  This document was prepared using Dragon voice recognition software and may include unintentional dictation errors.   Clarine Ozell LABOR, MD 06/09/24 2041  "

## 2024-06-09 NOTE — ED Triage Notes (Signed)
 Pt reports left foot pain since last Wednesday. Swelling noted to foot and ankle. Baseline 4L [place] for COPD.

## 2024-06-09 NOTE — Discharge Instructions (Signed)
 Your evaluation in the emergency department was overall reassuring.  We saw no evidence of a blood clot, fracture, or infection.  Is possible you had venous stasis, we may have a mild gout flare--I prescribed you medication to help with this in case your pain and swelling is due to gout.  Continue to wear your compression stockings as tolerated, and please do follow-up with your primary care provider for reevaluation.  Return to the emergency department with any new or worsening symptoms.

## 2024-06-09 NOTE — ED Provider Triage Note (Signed)
 Emergency Medicine Provider Triage Evaluation Note  Charles Rose , a 83 y.o. male  was evaluated in triage.  Pt complains of left foot pain and swelling without specific injury x 3 days. No previous swelling similar to this. No worsening shortness of breath. On 4liters [place] at baseline.    Physical Exam  BP (!) 162/85 (BP Location: Right Arm)   Pulse 80   Temp 97.8 F (36.6 C) (Oral)   Resp 17   Ht 5' 9 (1.753 m)   Wt 69 kg   SpO2 98%   BMI 22.46 kg/m  Gen:   Awake, no distress   Resp:  Normal effort  MSK:   Moves extremities without difficulty  Other:    Medical Decision Making  Medically screening exam initiated at 3:11 PM.  Appropriate orders placed.  Charles Rose was informed that the remainder of the evaluation will be completed by another provider, this initial triage assessment does not replace that evaluation, and the importance of remaining in the ED until their evaluation is complete.    Herlinda Kirk NOVAK, FNP 06/09/24 [phone]
